# Patient Record
Sex: Female | Born: 1975 | Race: White | Hispanic: No | Marital: Single | Smoking: Current every day smoker
Health system: Southern US, Community
[De-identification: ages and names within clinical notes are randomized; demographics above are authoritative.]

## PROBLEM LIST (undated history)

## (undated) DIAGNOSIS — R42 Dizziness and giddiness: Secondary | ICD-10-CM

## (undated) HISTORY — PX: TUBAL LIGATION: SHX77

---

## 1997-09-15 ENCOUNTER — Encounter: Admission: RE | Admit: 1997-09-15 | Discharge: 1997-09-15 | Payer: Self-pay | Admitting: Family Medicine

## 1997-10-11 ENCOUNTER — Encounter: Admission: RE | Admit: 1997-10-11 | Discharge: 1997-10-11 | Payer: Self-pay | Admitting: Family Medicine

## 1997-10-13 ENCOUNTER — Ambulatory Visit (HOSPITAL_COMMUNITY): Admission: RE | Admit: 1997-10-13 | Discharge: 1997-10-13 | Payer: Self-pay | Admitting: *Deleted

## 1997-11-10 ENCOUNTER — Encounter: Admission: RE | Admit: 1997-11-10 | Discharge: 1997-11-10 | Payer: Self-pay | Admitting: Sports Medicine

## 1997-11-12 ENCOUNTER — Emergency Department (HOSPITAL_COMMUNITY): Admission: EM | Admit: 1997-11-12 | Discharge: 1997-11-12 | Payer: Self-pay | Admitting: Emergency Medicine

## 1997-11-15 ENCOUNTER — Encounter: Admission: RE | Admit: 1997-11-15 | Discharge: 1997-11-15 | Payer: Self-pay | Admitting: Family Medicine

## 1997-11-29 ENCOUNTER — Encounter: Admission: RE | Admit: 1997-11-29 | Discharge: 1998-02-27 | Payer: Self-pay | Admitting: *Deleted

## 1997-11-30 ENCOUNTER — Encounter: Admission: RE | Admit: 1997-11-30 | Discharge: 1997-11-30 | Payer: Self-pay | Admitting: Family Medicine

## 1997-12-15 ENCOUNTER — Encounter: Admission: RE | Admit: 1997-12-15 | Discharge: 1997-12-15 | Payer: Self-pay | Admitting: Family Medicine

## 1997-12-27 ENCOUNTER — Encounter: Admission: RE | Admit: 1997-12-27 | Discharge: 1997-12-27 | Payer: Self-pay | Admitting: Family Medicine

## 1998-01-09 ENCOUNTER — Ambulatory Visit (HOSPITAL_COMMUNITY): Admission: RE | Admit: 1998-01-09 | Discharge: 1998-01-09 | Payer: Self-pay | Admitting: *Deleted

## 1998-01-12 ENCOUNTER — Encounter: Admission: RE | Admit: 1998-01-12 | Discharge: 1998-01-12 | Payer: Self-pay | Admitting: Family Medicine

## 1998-01-31 ENCOUNTER — Encounter: Admission: RE | Admit: 1998-01-31 | Discharge: 1998-01-31 | Payer: Self-pay | Admitting: Sports Medicine

## 1998-02-06 ENCOUNTER — Encounter (HOSPITAL_COMMUNITY): Admission: RE | Admit: 1998-02-06 | Discharge: 1998-02-16 | Payer: Self-pay | Admitting: Obstetrics & Gynecology

## 1998-02-15 ENCOUNTER — Inpatient Hospital Stay (HOSPITAL_COMMUNITY): Admission: AD | Admit: 1998-02-15 | Discharge: 1998-02-17 | Payer: Self-pay | Admitting: *Deleted

## 1998-02-17 ENCOUNTER — Encounter: Admission: RE | Admit: 1998-02-17 | Discharge: 1998-02-17 | Payer: Self-pay | Admitting: Family Medicine

## 1998-03-29 ENCOUNTER — Encounter: Admission: RE | Admit: 1998-03-29 | Discharge: 1998-03-29 | Payer: Self-pay | Admitting: Family Medicine

## 1998-03-29 ENCOUNTER — Other Ambulatory Visit: Admission: RE | Admit: 1998-03-29 | Discharge: 1998-03-29 | Payer: Self-pay | Admitting: Family Medicine

## 1998-08-23 ENCOUNTER — Emergency Department (HOSPITAL_COMMUNITY): Admission: EM | Admit: 1998-08-23 | Discharge: 1998-08-23 | Payer: Self-pay | Admitting: Emergency Medicine

## 1999-04-11 ENCOUNTER — Emergency Department (HOSPITAL_COMMUNITY): Admission: EM | Admit: 1999-04-11 | Discharge: 1999-04-11 | Payer: Self-pay | Admitting: *Deleted

## 1999-04-18 ENCOUNTER — Inpatient Hospital Stay (HOSPITAL_COMMUNITY): Admission: AD | Admit: 1999-04-18 | Discharge: 1999-04-18 | Payer: Self-pay | Admitting: *Deleted

## 1999-05-04 ENCOUNTER — Emergency Department (HOSPITAL_COMMUNITY): Admission: EM | Admit: 1999-05-04 | Discharge: 1999-05-04 | Payer: Self-pay | Admitting: Emergency Medicine

## 1999-05-13 ENCOUNTER — Emergency Department (HOSPITAL_COMMUNITY): Admission: EM | Admit: 1999-05-13 | Discharge: 1999-05-13 | Payer: Self-pay | Admitting: Emergency Medicine

## 1999-07-28 ENCOUNTER — Encounter: Payer: Self-pay | Admitting: *Deleted

## 1999-07-28 ENCOUNTER — Inpatient Hospital Stay (HOSPITAL_COMMUNITY): Admission: AD | Admit: 1999-07-28 | Discharge: 1999-07-28 | Payer: Self-pay | Admitting: Obstetrics

## 1999-08-13 ENCOUNTER — Encounter: Admission: RE | Admit: 1999-08-13 | Discharge: 1999-08-13 | Payer: Self-pay | Admitting: Family Medicine

## 1999-08-22 ENCOUNTER — Encounter: Admission: RE | Admit: 1999-08-22 | Discharge: 1999-08-22 | Payer: Self-pay | Admitting: Family Medicine

## 1999-08-28 ENCOUNTER — Encounter: Admission: RE | Admit: 1999-08-28 | Discharge: 1999-08-28 | Payer: Self-pay | Admitting: Family Medicine

## 1999-08-28 ENCOUNTER — Other Ambulatory Visit: Admission: RE | Admit: 1999-08-28 | Discharge: 1999-08-28 | Payer: Self-pay | Admitting: *Deleted

## 1999-10-02 ENCOUNTER — Encounter: Admission: RE | Admit: 1999-10-02 | Discharge: 1999-10-02 | Payer: Self-pay | Admitting: Family Medicine

## 1999-11-01 ENCOUNTER — Encounter: Admission: RE | Admit: 1999-11-01 | Discharge: 1999-11-01 | Payer: Self-pay | Admitting: Family Medicine

## 1999-11-12 ENCOUNTER — Inpatient Hospital Stay (HOSPITAL_COMMUNITY): Admission: AD | Admit: 1999-11-12 | Discharge: 1999-11-12 | Payer: Self-pay | Admitting: Obstetrics

## 1999-11-13 ENCOUNTER — Ambulatory Visit (HOSPITAL_COMMUNITY): Admission: RE | Admit: 1999-11-13 | Discharge: 1999-11-13 | Payer: Self-pay | Admitting: Sports Medicine

## 1999-12-04 ENCOUNTER — Encounter: Admission: RE | Admit: 1999-12-04 | Discharge: 1999-12-04 | Payer: Self-pay | Admitting: Sports Medicine

## 1999-12-05 ENCOUNTER — Encounter: Admission: RE | Admit: 1999-12-05 | Discharge: 1999-12-05 | Payer: Self-pay | Admitting: Family Medicine

## 2000-01-03 ENCOUNTER — Encounter: Admission: RE | Admit: 2000-01-03 | Discharge: 2000-01-03 | Payer: Self-pay | Admitting: Family Medicine

## 2000-01-21 ENCOUNTER — Encounter: Admission: RE | Admit: 2000-01-21 | Discharge: 2000-01-21 | Payer: Self-pay | Admitting: Family Medicine

## 2000-01-24 ENCOUNTER — Ambulatory Visit (HOSPITAL_COMMUNITY): Admission: RE | Admit: 2000-01-24 | Discharge: 2000-01-24 | Payer: Self-pay

## 2000-02-07 ENCOUNTER — Encounter: Admission: RE | Admit: 2000-02-07 | Discharge: 2000-02-07 | Payer: Self-pay | Admitting: Family Medicine

## 2000-02-14 ENCOUNTER — Encounter: Admission: RE | Admit: 2000-02-14 | Discharge: 2000-02-14 | Payer: Self-pay | Admitting: Family Medicine

## 2000-02-21 ENCOUNTER — Inpatient Hospital Stay (HOSPITAL_COMMUNITY): Admission: AD | Admit: 2000-02-21 | Discharge: 2000-02-21 | Payer: Self-pay | Admitting: *Deleted

## 2000-02-21 ENCOUNTER — Encounter: Admission: RE | Admit: 2000-02-21 | Discharge: 2000-02-21 | Payer: Self-pay | Admitting: Family Medicine

## 2000-02-28 ENCOUNTER — Encounter: Admission: RE | Admit: 2000-02-28 | Discharge: 2000-02-28 | Payer: Self-pay | Admitting: Family Medicine

## 2000-03-03 ENCOUNTER — Inpatient Hospital Stay (HOSPITAL_COMMUNITY): Admission: AD | Admit: 2000-03-03 | Discharge: 2000-03-03 | Payer: Self-pay | Admitting: Obstetrics

## 2000-03-12 ENCOUNTER — Inpatient Hospital Stay (HOSPITAL_COMMUNITY): Admission: AD | Admit: 2000-03-12 | Discharge: 2000-03-12 | Payer: Self-pay | Admitting: Obstetrics

## 2000-03-13 ENCOUNTER — Encounter: Admission: RE | Admit: 2000-03-13 | Discharge: 2000-03-13 | Payer: Self-pay | Admitting: Family Medicine

## 2000-03-14 ENCOUNTER — Encounter (HOSPITAL_COMMUNITY): Admission: RE | Admit: 2000-03-14 | Discharge: 2000-03-17 | Payer: Self-pay | Admitting: *Deleted

## 2000-03-16 ENCOUNTER — Inpatient Hospital Stay (HOSPITAL_COMMUNITY): Admission: AD | Admit: 2000-03-16 | Discharge: 2000-03-18 | Payer: Self-pay | Admitting: Obstetrics & Gynecology

## 2000-03-16 ENCOUNTER — Encounter (INDEPENDENT_AMBULATORY_CARE_PROVIDER_SITE_OTHER): Payer: Self-pay | Admitting: Specialist

## 2000-03-27 ENCOUNTER — Encounter (INDEPENDENT_AMBULATORY_CARE_PROVIDER_SITE_OTHER): Payer: Self-pay | Admitting: *Deleted

## 2000-03-27 LAB — CONVERTED CEMR LAB

## 2000-04-10 ENCOUNTER — Encounter: Admission: RE | Admit: 2000-04-10 | Discharge: 2000-04-10 | Payer: Self-pay | Admitting: Family Medicine

## 2000-04-28 ENCOUNTER — Other Ambulatory Visit: Admission: RE | Admit: 2000-04-28 | Discharge: 2000-04-28 | Payer: Self-pay | Admitting: Family Medicine

## 2000-04-28 ENCOUNTER — Encounter: Admission: RE | Admit: 2000-04-28 | Discharge: 2000-04-28 | Payer: Self-pay | Admitting: Family Medicine

## 2000-09-29 ENCOUNTER — Emergency Department (HOSPITAL_COMMUNITY): Admission: EM | Admit: 2000-09-29 | Discharge: 2000-09-29 | Payer: Self-pay | Admitting: *Deleted

## 2000-09-29 ENCOUNTER — Encounter: Payer: Self-pay | Admitting: *Deleted

## 2003-08-08 ENCOUNTER — Emergency Department (HOSPITAL_COMMUNITY): Admission: EM | Admit: 2003-08-08 | Discharge: 2003-08-09 | Payer: Self-pay | Admitting: Emergency Medicine

## 2003-08-26 ENCOUNTER — Encounter: Admission: RE | Admit: 2003-08-26 | Discharge: 2003-08-26 | Payer: Self-pay | Admitting: Family Medicine

## 2003-12-15 ENCOUNTER — Emergency Department (HOSPITAL_COMMUNITY): Admission: EM | Admit: 2003-12-15 | Discharge: 2003-12-15 | Payer: Self-pay | Admitting: Emergency Medicine

## 2004-04-11 ENCOUNTER — Emergency Department (HOSPITAL_COMMUNITY): Admission: EM | Admit: 2004-04-11 | Discharge: 2004-04-11 | Payer: Self-pay | Admitting: Emergency Medicine

## 2004-10-01 ENCOUNTER — Emergency Department (HOSPITAL_COMMUNITY): Admission: EM | Admit: 2004-10-01 | Discharge: 2004-10-01 | Payer: Self-pay | Admitting: *Deleted

## 2004-10-24 ENCOUNTER — Ambulatory Visit: Payer: Self-pay | Admitting: Family Medicine

## 2004-11-15 ENCOUNTER — Ambulatory Visit: Payer: Self-pay | Admitting: Family Medicine

## 2005-02-28 ENCOUNTER — Inpatient Hospital Stay (HOSPITAL_COMMUNITY): Admission: RE | Admit: 2005-02-28 | Discharge: 2005-03-02 | Payer: Self-pay | Admitting: Psychiatry

## 2005-03-01 ENCOUNTER — Ambulatory Visit: Payer: Self-pay | Admitting: Psychiatry

## 2005-04-26 ENCOUNTER — Ambulatory Visit: Payer: Self-pay | Admitting: Family Medicine

## 2005-05-06 ENCOUNTER — Ambulatory Visit: Payer: Self-pay | Admitting: Sports Medicine

## 2005-06-20 ENCOUNTER — Ambulatory Visit: Payer: Self-pay | Admitting: Family Medicine

## 2005-08-29 ENCOUNTER — Emergency Department (HOSPITAL_COMMUNITY): Admission: EM | Admit: 2005-08-29 | Discharge: 2005-08-29 | Payer: Self-pay | Admitting: Emergency Medicine

## 2005-12-22 ENCOUNTER — Emergency Department (HOSPITAL_COMMUNITY): Admission: EM | Admit: 2005-12-22 | Discharge: 2005-12-22 | Payer: Self-pay | Admitting: Emergency Medicine

## 2006-04-14 ENCOUNTER — Encounter: Admission: RE | Admit: 2006-04-14 | Discharge: 2006-04-14 | Payer: Self-pay | Admitting: Occupational Medicine

## 2006-04-29 ENCOUNTER — Ambulatory Visit: Payer: Self-pay | Admitting: Family Medicine

## 2006-05-27 ENCOUNTER — Emergency Department (HOSPITAL_COMMUNITY): Admission: EM | Admit: 2006-05-27 | Discharge: 2006-05-27 | Payer: Self-pay | Admitting: Emergency Medicine

## 2006-07-24 DIAGNOSIS — M545 Low back pain: Secondary | ICD-10-CM

## 2006-07-24 DIAGNOSIS — F17200 Nicotine dependence, unspecified, uncomplicated: Secondary | ICD-10-CM | POA: Insufficient documentation

## 2006-07-24 DIAGNOSIS — L2089 Other atopic dermatitis: Secondary | ICD-10-CM | POA: Insufficient documentation

## 2006-07-25 ENCOUNTER — Encounter (INDEPENDENT_AMBULATORY_CARE_PROVIDER_SITE_OTHER): Payer: Self-pay | Admitting: *Deleted

## 2006-08-28 ENCOUNTER — Encounter (INDEPENDENT_AMBULATORY_CARE_PROVIDER_SITE_OTHER): Payer: Self-pay | Admitting: Family Medicine

## 2006-08-28 ENCOUNTER — Other Ambulatory Visit: Admission: RE | Admit: 2006-08-28 | Discharge: 2006-08-28 | Payer: Self-pay | Admitting: Family Medicine

## 2006-08-28 ENCOUNTER — Ambulatory Visit: Payer: Self-pay | Admitting: Sports Medicine

## 2006-08-28 DIAGNOSIS — N6019 Diffuse cystic mastopathy of unspecified breast: Secondary | ICD-10-CM | POA: Insufficient documentation

## 2006-08-28 LAB — CONVERTED CEMR LAB
CO2: 21 meq/L (ref 19–32)
Chloride: 107 meq/L (ref 96–112)
Creatinine, Ser: 1.03 mg/dL (ref 0.40–1.20)

## 2006-08-29 ENCOUNTER — Encounter (INDEPENDENT_AMBULATORY_CARE_PROVIDER_SITE_OTHER): Payer: Self-pay | Admitting: Family Medicine

## 2006-08-29 LAB — CONVERTED CEMR LAB: T3, Free: 3 pg/mL (ref 2.3–4.2)

## 2006-09-02 ENCOUNTER — Encounter (INDEPENDENT_AMBULATORY_CARE_PROVIDER_SITE_OTHER): Payer: Self-pay | Admitting: Family Medicine

## 2006-09-03 ENCOUNTER — Encounter: Admission: RE | Admit: 2006-09-03 | Discharge: 2006-09-03 | Payer: Self-pay | Admitting: Sports Medicine

## 2006-09-03 ENCOUNTER — Encounter (INDEPENDENT_AMBULATORY_CARE_PROVIDER_SITE_OTHER): Payer: Self-pay | Admitting: Family Medicine

## 2006-10-02 ENCOUNTER — Ambulatory Visit: Payer: Self-pay | Admitting: Family Medicine

## 2006-10-02 ENCOUNTER — Telehealth (INDEPENDENT_AMBULATORY_CARE_PROVIDER_SITE_OTHER): Payer: Self-pay | Admitting: *Deleted

## 2006-10-02 ENCOUNTER — Encounter: Payer: Self-pay | Admitting: Family Medicine

## 2006-10-02 DIAGNOSIS — L293 Anogenital pruritus, unspecified: Secondary | ICD-10-CM | POA: Insufficient documentation

## 2006-10-02 LAB — CONVERTED CEMR LAB
Glucose, Urine, Semiquant: NEGATIVE
Ketones, urine, test strip: NEGATIVE
Nitrite: NEGATIVE
Specific Gravity, Urine: 1.015
pH: 7

## 2006-10-08 ENCOUNTER — Encounter: Payer: Self-pay | Admitting: Family Medicine

## 2006-10-08 ENCOUNTER — Telehealth (INDEPENDENT_AMBULATORY_CARE_PROVIDER_SITE_OTHER): Payer: Self-pay | Admitting: Family Medicine

## 2006-12-28 ENCOUNTER — Encounter: Admission: RE | Admit: 2006-12-28 | Discharge: 2006-12-28 | Payer: Self-pay | Admitting: Family Medicine

## 2007-08-10 ENCOUNTER — Telehealth (INDEPENDENT_AMBULATORY_CARE_PROVIDER_SITE_OTHER): Payer: Self-pay | Admitting: Family Medicine

## 2007-08-11 ENCOUNTER — Telehealth: Payer: Self-pay | Admitting: *Deleted

## 2007-09-11 ENCOUNTER — Encounter (INDEPENDENT_AMBULATORY_CARE_PROVIDER_SITE_OTHER): Payer: Self-pay | Admitting: Family Medicine

## 2007-09-11 ENCOUNTER — Ambulatory Visit: Payer: Self-pay | Admitting: Family Medicine

## 2007-09-11 ENCOUNTER — Other Ambulatory Visit: Admission: RE | Admit: 2007-09-11 | Discharge: 2007-09-11 | Payer: Self-pay | Admitting: Family Medicine

## 2007-09-11 DIAGNOSIS — R079 Chest pain, unspecified: Secondary | ICD-10-CM

## 2007-09-11 LAB — CONVERTED CEMR LAB
HDL: 46 mg/dL (ref >39)
LDL Cholesterol: 118 mg/dL — ABNORMAL HIGH (ref 0–99)
Total CHOL/HDL Ratio: 3.8

## 2007-09-15 ENCOUNTER — Telehealth (INDEPENDENT_AMBULATORY_CARE_PROVIDER_SITE_OTHER): Payer: Self-pay | Admitting: Family Medicine

## 2007-09-17 ENCOUNTER — Telehealth (INDEPENDENT_AMBULATORY_CARE_PROVIDER_SITE_OTHER): Payer: Self-pay | Admitting: Family Medicine

## 2007-11-20 ENCOUNTER — Encounter: Admission: RE | Admit: 2007-11-20 | Discharge: 2007-11-20 | Payer: Self-pay | Admitting: Family Medicine

## 2008-01-01 ENCOUNTER — Telehealth: Payer: Self-pay | Admitting: *Deleted

## 2008-01-24 ENCOUNTER — Telehealth: Payer: Self-pay | Admitting: Family Medicine

## 2008-01-25 ENCOUNTER — Encounter: Payer: Self-pay | Admitting: Family Medicine

## 2008-01-25 ENCOUNTER — Ambulatory Visit: Payer: Self-pay | Admitting: Family Medicine

## 2008-01-25 ENCOUNTER — Encounter: Admission: RE | Admit: 2008-01-25 | Discharge: 2008-01-25 | Payer: Self-pay | Admitting: Family Medicine

## 2008-01-25 DIAGNOSIS — M79609 Pain in unspecified limb: Secondary | ICD-10-CM | POA: Insufficient documentation

## 2008-01-26 ENCOUNTER — Telehealth: Payer: Self-pay | Admitting: Family Medicine

## 2008-03-08 ENCOUNTER — Telehealth (INDEPENDENT_AMBULATORY_CARE_PROVIDER_SITE_OTHER): Payer: Self-pay | Admitting: *Deleted

## 2008-05-02 ENCOUNTER — Telehealth: Payer: Self-pay | Admitting: *Deleted

## 2008-07-18 ENCOUNTER — Telehealth (INDEPENDENT_AMBULATORY_CARE_PROVIDER_SITE_OTHER): Payer: Self-pay | Admitting: Family Medicine

## 2008-11-15 ENCOUNTER — Telehealth (INDEPENDENT_AMBULATORY_CARE_PROVIDER_SITE_OTHER): Payer: Self-pay | Admitting: Family Medicine

## 2008-12-29 ENCOUNTER — Telehealth: Payer: Self-pay | Admitting: *Deleted

## 2009-09-04 ENCOUNTER — Telehealth: Payer: Self-pay | Admitting: Family Medicine

## 2010-04-10 ENCOUNTER — Ambulatory Visit: Payer: Self-pay | Admitting: Family Medicine

## 2010-04-10 ENCOUNTER — Other Ambulatory Visit: Admission: RE | Admit: 2010-04-10 | Discharge: 2010-04-10 | Payer: Self-pay | Admitting: Family Medicine

## 2010-04-10 DIAGNOSIS — E785 Hyperlipidemia, unspecified: Secondary | ICD-10-CM

## 2010-04-10 DIAGNOSIS — N63 Unspecified lump in unspecified breast: Secondary | ICD-10-CM

## 2010-04-12 LAB — CONVERTED CEMR LAB: Pap Smear: NEGATIVE

## 2010-04-16 ENCOUNTER — Encounter: Admission: RE | Admit: 2010-04-16 | Discharge: 2010-04-16 | Payer: Self-pay | Admitting: Family Medicine

## 2010-04-23 ENCOUNTER — Telehealth: Payer: Self-pay | Admitting: Family Medicine

## 2010-06-26 NOTE — Progress Notes (Signed)
Summary: triage: fell and hit head  Phone Note Call from Patient Call back at [phone]   Caller: Patient Summary of Call: Pt fell yesterday and hit her head.   Initial call taken by: Clydell Hakim,  September 04, 2009 9:32 AM  Follow-up for Phone Call        3PM YESTERDAY FELL DOWN BACK STEPS & HIT THE BACK OF HER HEAD. HAS A BUMP. EATING FELL, NO NAUSEA.  had a HA yesterday. none today. pain was gone by 6pm. took motrin. iced the bump. not sure if she should be seen. will come at 3:30 as work in today Follow-up by: Golden Circle RN,  September 04, 2009 9:36 AM  Additional Follow-up for Phone Call Additional follow up Details #1::        thanks for working her in.  Additional Follow-up by: Jamie Brookes MD,  September 04, 2009 4:36 PM

## 2010-06-26 NOTE — Assessment & Plan Note (Signed)
Summary: pap/pelvic/bmc   call Pt at [phone]   Vital Signs:  Patient profile:   35 year old female LMP:     03/27/2010 Weight:      173 pounds Temp:     98.8 degrees F oral Pulse rate:   84 / minute Pulse rhythm:   regular BP sitting:   123 / 81  (left arm) Cuff size:   regular  Vitals Entered By: Loralee Pacas CMA (April 10, 2010 10:40 AM) CC: breast lump, knee pain, pap smear Is Patient Diabetic? No LMP (date): 03/27/2010     Enter LMP: 03/27/2010 Last PAP Result normal   Primary Care Provider:  Jamie Brookes MD  CC:  breast lump, knee pain, and pap smear.  History of Present Illness: Breast lump: Pt comes in today concerned about a lump in her breast. her mom had breast ca sometime in her 30's per patient. Shestarted having breast mammograms at age 27 and has had a total of 2. Pt says that cysts were seen in the past but she has not had to have any biopsy's.   knee pain; Pt has some knee pain when she works on her knees at [employer]. She is not allowed to wear knee pads on the outside of her uniform. 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   Pap smear: Pt is due for a pap smear today.    Habits & Providers  Alcohol-Tobacco-Diet     Alcohol drinks/day: <1     Tobacco Status: current     Cigarette Packs/Day:  ~1ppd  Exercise-Depression-Behavior     Does Patient Exercise: yes     Exercise Counseling: to improve exercise regimen     Type of exercise: cardio     Exercise (avg: min/session): 30-60     Times/week: <3     Have you felt down or hopeless? no     Have you felt little pleasure in things? yes     Depression Counseling: further diagnostic testing and/or other treatment is indicated     Seat Belt Use: always  Current Medications (verified): 1)  None  Allergies (verified): No Known Drug Allergies  Social History: Does Patient Exercise:  yes Seat Belt Use:   always  Review of Systems        vitals reviewed and pertinent negatives and positives seen in HPI   Physical Exam  General:  Well-developed,well-nourished,in no acute distress; alert,appropriate and cooperative throughout examination Breasts:  normal left breast, Rt breast had small lump at 12:00. Genitalia:  Normal introitus for age, no external lesions, no vaginal discharge, mucosa pink and moist, no vaginal or cervical lesions, no vaginal atrophy, no friaility or hemorrhage, normal uterus size and position, no adnexal masses or tenderness Psych:  Cognition and judgment appear intact. Alert and cooperative with normal attention span and concentration. No apparent delusions, illusions, hallucinations   Impression & Recommendations:  Problem # 1:  BREAST MASS, RIGHT (ICD-611.72) Assessment New New breast mass at 12:00. Will send for [place] and or mammogram. Pt had a mom with Bc Ca in her 30's and has already started getting mammograms. She should continue with this.   Orders: Mammogram (Diagnostic) (Mammo) [facility]- Est Level  3 (09811)  Problem # 2:  SCREENING FOR MALIGNANT NEOPLASM, CERVIX (ICD-V76.2) Assessment: Unchanged Pap smear done today.   Orders: Pap Smear-[facility] ([id number]) [facility]- Est Level  3 (13086)  Other Orders: Future Orders: Lipid-[facility] ([id number]) ... 04/11/2011  Patient Instructions: 1)  IT was nice to meet you today.  2)  I felt a lump in the breast which may be a cyst but I would like to have it evaluated closer.  3)  We are setting up a mammogram for you.  4)  You have had elevated LDL cholesterol in the past. We need to have it checked again at your convienence.  5)  Have a wonderful holiday season.    Orders Added: 1)  Lipid-[facility] [[id number]] 2)  Pap Smear-[facility] [[id number]] 3)  Mammogram (Diagnostic) [Mammo] 4)  [facility]- Est Level  3 [33295]

## 2010-06-26 NOTE — Progress Notes (Signed)
°  Phone Note Call from Patient   Caller: Patient Summary of Call: Ms. Tomasello is calling because she has not had a bowel movement in one week.  She talked to the pharmacist today, and he recommended miralax.  She has tried one 17 g dose of this this AM without relief.  She is passing gas.  Denies abdominal pain or vomiting.  States she just feels bloated and full and would like relief.  She is wondering if she should take a stimulant laxative.  Recommended taking the miralax in gatorade every 4 hours until result produced, and to use either a fleet's enema or a dulcolax suppository from below.  Recommended against stimulant laxative by mouth.  If no BM overnight with these measures, advised patient to make work in appt at [facility] to ensure no impaction.  Advised to report to ER for failure to pass gas, pain, vomiting. Patient in agreement. Initial call taken by: Levander Campion MD,  August 10, 2007 7:45 PM

## 2010-06-26 NOTE — Progress Notes (Signed)
Summary: Req rx: Smoking Cessation with Zyban.   Phone Note Call from Patient Call back at [phone]   Reason for Call: Talk to Doctor Summary of Call: pt wants to know if she can try wellbutrin to help her quit smoking, chantix did not work Initial call taken by: Knox Royalty,  April 23, 2010 3:41 PM  Follow-up for Phone Call        That sounds reasonable. She should start taking the medicine (1 tab by mouth qDay for 3 days and then increased to 1 tab by mouth two times a day after that (last dose no later than 6 pm)) and then plan to stop smoking 5-7 days after starting the medicine. Don't use with alcohol, don't crush or chew tablets.   I called patient and let her know the above. Meds sent into [facility].  Follow-up by: Jamie Brookes MD,  April 24, 2010 8:58 AM    New/Updated Medications: BUPROPION HCL (SMOKING DETER) 150 MG XR12H-TAB (BUPROPION HCL (SMOKING DETER)) take 1 tab in the AM for 3 days, then increase to 1 in the AM and 1 in the PM (before 6 pm). Quit smoking 5-7 days after starting Prescriptions: BUPROPION HCL (SMOKING DETER) 150 MG XR12H-TAB (BUPROPION HCL (SMOKING DETER)) take 1 tab in the AM for 3 days, then increase to 1 in the AM and 1 in the PM (before 6 pm). Quit smoking 5-7 days after starting  #60 x 1   Entered and Authorized by:   Jamie Brookes MD   Signed by:   Jamie Brookes MD on 04/24/2010   Method used:   Electronically to        [facility] [phone]* (retail)       [address]       [place], [place]  [address]       Ph: [phone] or [phone]       Fax: [phone]   RxID:   [id number]

## 2010-06-27 ENCOUNTER — Encounter: Payer: Self-pay | Admitting: *Deleted

## 2010-08-13 ENCOUNTER — Telehealth: Payer: Self-pay | Admitting: Family Medicine

## 2010-08-13 NOTE — Telephone Encounter (Signed)
Patient has been on her menstrual cycle x 14 days.  It started out as normal period, lasting about 4 to 5 days, spotted a few days afterwards then started back again with a heavy flow the following week.  Says that she has gone through a full box of pads in the past 2 weeks.  Patient does not use BC, had her tubes tied after the birth of her 34 y/o daughter.  Has not experienced any cramping or abdominal pain with this period, other than the typical cramping the first few days after her cycle first came on.  Advised her that she should be seen by her PCP.  Gave her a 4:14 appt this Thursday.

## 2010-08-13 NOTE — Telephone Encounter (Signed)
Pt has been on  cycle for 14 days, offered pt an appt, would like to speak with RN 1st

## 2010-08-14 ENCOUNTER — Ambulatory Visit (INDEPENDENT_AMBULATORY_CARE_PROVIDER_SITE_OTHER): Payer: Self-pay | Admitting: Family Medicine

## 2010-08-14 ENCOUNTER — Telehealth: Payer: Self-pay | Admitting: Family Medicine

## 2010-08-14 ENCOUNTER — Encounter: Payer: Self-pay | Admitting: Family Medicine

## 2010-08-14 VITALS — BP 112/80 | HR 85 | Temp 98.6°F | Wt 169.2 lb

## 2010-08-14 DIAGNOSIS — N920 Excessive and frequent menstruation with regular cycle: Secondary | ICD-10-CM

## 2010-08-14 DIAGNOSIS — N938 Other specified abnormal uterine and vaginal bleeding: Secondary | ICD-10-CM | POA: Insufficient documentation

## 2010-08-14 LAB — CBC
MCH: 31.1 pg (ref 26.0–34.0)
MCHC: 33.2 g/dL (ref 30.0–36.0)
Platelets: 266 10*3/uL (ref 150–400)

## 2010-08-14 MED ORDER — MEDROXYPROGESTERONE ACETATE 10 MG PO TABS: 10.0000 mg | ORAL_TABLET | Freq: Every day | ORAL | Status: DC

## 2010-08-14 NOTE — Progress Notes (Signed)
  Subjective:    Patient ID: [name], female    DOB: February 21, 1976, 35 y.o.   MRN: [id number]  HPI 1. Menorrhagia:  Pt has had vaginal bleeding for the past 2 weeks.  It started at the time of her normal cycle.  It has been heavy off and on since then.  She is using 4-5 pads a day.  It is not getting better.  She has never had something like this happen before.  She has not tried anything to make it better.  Nothing makes it worse.    FamHx: Mother with breast, colon, and uterine cancer   Review of Systems She endorses fatigue, headache, and mild light-headedness.  Denies abdominal pain, n/v, vaginal discharge, dysuria.    Objective:   Physical Exam  Constitutional: No distress.       Overweight  Eyes: Conjunctivae are normal. No scleral icterus.  Cardiovascular: Normal rate, regular rhythm and normal heart sounds.   Pulmonary/Chest: Effort normal and breath sounds normal. No respiratory distress. She has no wheezes.  Abdominal: Soft. Bowel sounds are normal. She exhibits no distension and no mass. There is no tenderness. There is no rebound and no guarding.  Musculoskeletal: She exhibits no edema.  Lymphadenopathy:    She has no cervical adenopathy.  Skin: Skin is warm and dry. No rash noted. She is not diaphoretic. No pallor.          Assessment & Plan:

## 2010-08-14 NOTE — Telephone Encounter (Signed)
Patient states she has had vaginal bleeding for a total of two weeks. She had spotting Sat , Sun and Monday. Last night started again with heavy bleeding and passing clots. Requested to be seen today. appointment scheduled for work in . Appointment 08/16/2010 cancelled.

## 2010-08-14 NOTE — Progress Notes (Deleted)
Patient states she has spotted for  days, Sat , Sun and Monday. Before that has had heavy bleeding off and on  and has had vaginal bleeding for total of about two weeks. Last night started again with heavy bleeding and passing clots. Appointment scheduled today for work in at patient 's request . Appointment Thursday cancelled.

## 2010-08-14 NOTE — Patient Instructions (Signed)
It was nice to meet you today I am going to put you on Provera to help stop the bleeding Please schedule a follow up appointment in a couple of weeks to check on your bleeding and discuss the need for further work up

## 2010-08-14 NOTE — Assessment & Plan Note (Signed)
Pt with vaginal bleeding for 2 weeks.  It is not getting better.  She is overweight so likely with too much estrogen.  Will treat with Provera to stop the bleeding.  She does have a significant family hx and further work up wouldn't be unreasonable.  Will have patient come back to discuss with PCP.  Pt may benefit from t/v [place].

## 2010-08-14 NOTE — Telephone Encounter (Signed)
Pt asking to speak with RN about her menstral cycle (see yesterdays phone note). Pt says her symptoms are different today.

## 2010-08-16 ENCOUNTER — Ambulatory Visit: Payer: Self-pay | Admitting: Family Medicine

## 2010-08-23 ENCOUNTER — Encounter: Payer: Self-pay | Admitting: Family Medicine

## 2010-08-23 ENCOUNTER — Ambulatory Visit (INDEPENDENT_AMBULATORY_CARE_PROVIDER_SITE_OTHER): Payer: Self-pay | Admitting: Family Medicine

## 2010-08-23 VITALS — BP 118/82 | HR 80 | Temp 98.5°F | Wt 170.6 lb

## 2010-08-23 DIAGNOSIS — N920 Excessive and frequent menstruation with regular cycle: Secondary | ICD-10-CM

## 2010-08-23 LAB — TSH: TSH: 1.379 u[IU]/mL (ref 0.350–4.500)

## 2010-08-23 NOTE — Assessment & Plan Note (Addendum)
Only spotting now. Will order TSH, urine preg today.  No fam or personal hx of bleeding do.  Will schedule transvaginal ultrasound and endometrial biopsy given her family history.  precepted with Dr. Mauricio Po

## 2010-08-23 NOTE — Patient Instructions (Signed)
Most reasons for irregular uterine bleeding are benign. Will check labwork today, I will send you letter if normal, will call you otherwise. I will schedule an ultrasound to look for causes of bleeding such a fibroids. Make appointment for follow-up in women's clinic for endometrial biopsy

## 2010-08-23 NOTE — Progress Notes (Signed)
  Subjective:    Patient ID: [name], female    DOB: [birth year], 35 y.o.   MRN: [id number]  HPI Mar 6 started menses at typical time.  Has no history of irregular or heavy periods.  Continued to bleed for 2 weeks.  Was Seen by Dr. Lelon Perla here on Mar 20, was given course of provera.  Patient states bleeding stopped during course of provera and light spotting restarted several days afterwards.  She is sexually active, has BTL.      Review of Systems  Review of Systems She endorses fatigue, headache, and mild light-headedness.  Denies abdominal pain, fever, n/v, vaginal discharge, dysuria.       Objective:   Physical Exam  Constitutional: She appears well-developed and well-nourished.  Cardiovascular: Normal rate and regular rhythm.   Pulmonary/Chest: Breath sounds normal. She is in respiratory distress. She has no wheezes. She has no rales.  Abdominal: Soft. She exhibits no distension. There is no tenderness. There is no rebound and no guarding.  Genitourinary: Uterus normal. Uterus is not enlarged, not fixed and not tender. Cervix exhibits no motion tenderness. Right adnexum displays no mass, no tenderness and no fullness. Left adnexum displays no mass, no tenderness and no fullness. There is bleeding around the vagina. No erythema or tenderness around the vagina. No foreign body around the vagina. No signs of injury around the vagina. No vaginal discharge found.          Assessment & Plan:

## 2010-08-28 ENCOUNTER — Ambulatory Visit (HOSPITAL_COMMUNITY)
Admission: RE | Admit: 2010-08-28 | Discharge: 2010-08-28 | Disposition: A | Discharge status: Home / Self Care | Payer: Self-pay | Source: Ambulatory Visit | Attending: Family Medicine | Admitting: Family Medicine

## 2010-08-28 ENCOUNTER — Other Ambulatory Visit: Payer: Self-pay | Admitting: Family Medicine

## 2010-08-28 ENCOUNTER — Telehealth: Payer: Self-pay | Admitting: Family Medicine

## 2010-08-28 DIAGNOSIS — N938 Other specified abnormal uterine and vaginal bleeding: Secondary | ICD-10-CM | POA: Insufficient documentation

## 2010-08-28 DIAGNOSIS — Z8543 Personal history of malignant neoplasm of ovary: Secondary | ICD-10-CM | POA: Insufficient documentation

## 2010-08-28 DIAGNOSIS — N920 Excessive and frequent menstruation with regular cycle: Secondary | ICD-10-CM

## 2010-08-28 DIAGNOSIS — N949 Unspecified condition associated with female genital organs and menstrual cycle: Secondary | ICD-10-CM | POA: Insufficient documentation

## 2010-08-28 DIAGNOSIS — Z803 Family history of malignant neoplasm of breast: Secondary | ICD-10-CM | POA: Insufficient documentation

## 2010-08-28 NOTE — Telephone Encounter (Signed)
Left message that ultrasound normal and to continue with appt with endometrial [place] as previously discussed.

## 2010-08-30 ENCOUNTER — Encounter: Payer: Self-pay | Admitting: Family Medicine

## 2010-08-30 ENCOUNTER — Ambulatory Visit: Payer: Self-pay | Admitting: Family Medicine

## 2010-08-30 ENCOUNTER — Other Ambulatory Visit: Payer: Self-pay | Admitting: Family Medicine

## 2010-08-30 ENCOUNTER — Ambulatory Visit (INDEPENDENT_AMBULATORY_CARE_PROVIDER_SITE_OTHER): Payer: Self-pay | Admitting: Family Medicine

## 2010-08-30 VITALS — BP 117/86 | HR 96 | Temp 99.2°F | Ht 62.0 in | Wt 168.0 lb

## 2010-08-30 DIAGNOSIS — Z800 Family history of malignant neoplasm of digestive organs: Secondary | ICD-10-CM

## 2010-08-30 DIAGNOSIS — N938 Other specified abnormal uterine and vaginal bleeding: Secondary | ICD-10-CM

## 2010-08-30 DIAGNOSIS — Z803 Family history of malignant neoplasm of breast: Secondary | ICD-10-CM

## 2010-08-30 DIAGNOSIS — N949 Unspecified condition associated with female genital organs and menstrual cycle: Secondary | ICD-10-CM

## 2010-08-30 MED ORDER — IBUPROFEN 200 MG PO TABS
400.0000 mg | ORAL_TABLET | Freq: Once | ORAL | Status: AC
  Administered 2010-08-30: 400 mg via ORAL

## 2010-08-30 NOTE — Progress Notes (Signed)
Addended by: Loralee Pacas on: 08/30/2010 05:26 PM   Modules accepted: Orders

## 2010-08-30 NOTE — Progress Notes (Signed)
  Subjective:    Patient ID: CHARIAH BAILEY, female    DOB: 1975/12/17, 35 y.o.   MRN: [id number]  HPI  Episode of 18 day menses last month. Had previously had normal regular menses. We gave her provera and that stopped the bleeding. Had some spotting for  A few days after. No abdominal pain. No weight loss. No fever. No vaginal d/c other t han the   Review of Systems See HPI    Objective:   Physical Exam    WDWN NAD GU externally normal Bimanual normal Patient given informed consent, signed copy in the chart. Appropriate time out taken. Sterile technique was used. The patient was placed in the lithotomy position and the cervix brought into view with sterile speculum. Portio of cervix cleansed x 3 with betadine swabs. A tenaculum was placed in the anterior lip of the cervix. The uterus was sounded for depth. A pipelle was introduced to into the uterus, suction created,  and an endometrial sample was obtained. All equipment was removed and accounted for.The patient tolerated the procedure well. Minimal spotting type bleeding. Patient given post procedure instructions. We will notify her of pathology results.     Assessment & Plan:  DUb in patient with significant FH cancer (Mom er = breast cancer at 30 yoa and CIS adenocarcinoma colon at age 64). Reviewed PUS--normal w 11 mm stripe Await path--suspect this is related to perimenopause. She may need cycling on provera monthly for a while--given that she is a smoke that ot mirena would be best options I think. We discussed that today. I also agree with extra vigilance given maternal hx. I would plan on colonoscopy at age 67 for her for 1st screen [phone]

## 2010-09-03 ENCOUNTER — Telehealth: Payer: Self-pay | Admitting: Family Medicine

## 2010-09-03 ENCOUNTER — Encounter: Payer: Self-pay | Admitting: Family Medicine

## 2010-09-03 NOTE — Telephone Encounter (Signed)
Dear [name] Team Please call and let her know the endometrial biopsy was normal. I am sending her a letter [facility]! Denny Levy

## 2010-09-03 NOTE — Telephone Encounter (Signed)
LVM for patient to call back. ?

## 2010-09-04 NOTE — Telephone Encounter (Signed)
[name] received results of bx, but still want to discuss with you the cause of problem to begin with.  Please call her and let her know your thoughts.  Please call her on her cell phone at [phone]

## 2010-09-04 NOTE — Telephone Encounter (Signed)
Spoke w her at length--her Mom has just been dx with a lung mass so she is extra worried about  Cancer in general. She will keep bleeding record and f/u in three months or so with either me or her PCP--in the interim if she gets worried about irregular menses I will be happy to discuss with her by phone.

## 2010-10-12 NOTE — Discharge Summary (Signed)
[name], [name] NO.:  [id number]   MEDICAL RECORD NO.:  [id number]          PATIENT TYPE:  IPS   LOCATION:  0506                          FACILITY:  [facility]   PHYSICIAN:  Geoffery Lyons, M.D.      DATE OF BIRTH:  1976-02-22   DATE OF ADMISSION:  02/28/2005  DATE OF DISCHARGE:  03/02/2005                                 DISCHARGE SUMMARY   CHIEF COMPLAINT AND PRESENT ILLNESS:  This was the first admission to [facility] for this 35 year old separated female with two  children, left by the husband for another woman at the beginning of  September.  Had much marital conflict for two years.  Had rejected Zoloft  from PCP six months prior to this admission.  Tearful, overwhelmed,  anhedonic, decreased concentration.  Has considered crashing her car,  deterred from suicide by love for two children.   PAST PSYCHIATRIC HISTORY:  No prior treatment.   ALCOHOL/DRUG HISTORY:  Denies active use of alcohol or any other substances.   MEDICAL HISTORY:  Noncontributory.   MEDICATIONS:  None.   PHYSICAL EXAMINATION:  Performed and failed to show any acute findings.   LABORATORY DATA:  Blood chemistry with glucose 88.  Liver enzymes with SGOT  16, SGPT 13, total bilirubin 1.1, TSH 1.071.   MENTAL STATUS EXAM:  Fully alert, pleasant, cooperative female.  Speech  normal rate, tempo and production.  Mood depressed, overwhelmed.  Affect  depressed.  Thought processes positive for being overwhelmed, suicide  ruminations, can contract for safety.  No delusions.  No hallucinations.  Cognition was well-preserved.   ADMISSION DIAGNOSES:  AXIS I:  Major depression.  AXIS II:  No diagnosis.  AXIS III:  No diagnosis.  AXIS IV:  Moderate.  AXIS V:  GAF upon admission 30; highest GAF in the last year 70.   HOSPITAL COURSE:  She was admitted.  She was started in individual and group  psychotherapy.  She was given Ambien for sleep, Vistaril for anxiety.  Restarted on  Zoloft 75 mg per day.  She endorsed increased signs and  symptoms of depression, anxiety.  Claimed that after recommitting to the  marriage, her husband decided to see someone else.  Endorsed that he had  cheated on her before, then he said he was not going to do it anymore.  Endorsed that she did not trust him anymore.  Endorsed she went through the  same feeling when she lost her job.  She is taking care of their two  children.  The husband is not paying child support and she was to take care  of her paraplegic father.  Also endorsed that she heard the 14-year-old tell  her she wanted husband's girlfriend to be her mommy.  Endorsed this really  affected her.  Became more overwhelmed.  She wanted to get healthy and be  there for the children.  We started the Zoloft 25 mg per day.  We started  working on Pharmacologist and stress management.  On March 02, 2005, she was  wanting to go home.  She felt that she had been able to talk, get a lot of  these things out of her mind.  She felt that she was going to be able to  take it from here.  Had tolerated the Zoloft well and was willing to  continue it.  As she was in full contact with reality, committed to  treatment, we went ahead and discharged to outpatient follow-up.   DISCHARGE DIAGNOSES:  AXIS I:  Major depression.  Anxiety disorder not  otherwise specified.  AXIS II:  No diagnosis.  AXIS III:  No diagnosis.  AXIS IV:  Moderate.  AXIS V:  GAF upon discharge 50-55.   DISCHARGE MEDICATIONS:  Zoloft 25 mg per day with plans to increase to 50 mg  per day.   FOLLOW UP:  [facility].      Geoffery Lyons, M.D.  Electronically Signed     IL/MEDQ  D:  04/01/2005  T:  04/02/2005  Job:  [id number]

## 2011-02-25 ENCOUNTER — Telehealth: Payer: Self-pay | Admitting: Family Medicine

## 2011-02-25 NOTE — Telephone Encounter (Signed)
Patient states since March and the course of provera  her periods have been fine. 3-4 days in length. She had a period 2 weeks ago . Today she has started spotting again. Had some mild cramping this AM. She wants to know how long she should wait  before she comes back in. Last time she got really weak because it had gone on for 18 days. Will send message to Dr. Rivka Safer to advise.

## 2011-02-25 NOTE — Telephone Encounter (Signed)
You can advise the patient to schedule an appointment to see [place] if she gets weak, or the bleeding continues to be irregular.

## 2011-02-25 NOTE — Telephone Encounter (Signed)
Needs to talk to nurse about prolonged menses.  She was here in spring and is now experiencing same thing.  Can't afford to come back in yet, but wants to talk to nurse.

## 2011-02-25 NOTE — Telephone Encounter (Signed)
Patient notified

## 2011-07-06 ENCOUNTER — Telehealth: Payer: Self-pay | Admitting: Family Medicine

## 2011-07-06 NOTE — Telephone Encounter (Signed)
Called with feeling dizzy since this AM.  No n/v/d, fevers, chills, neck stiffness, visual changes, or problems breathing.  Mainly sense of disequilibrium.  Recommended that patient take benadryl and rest throughout the day as symptoms like these are most often related to URI.  Instructed patient that, if any of the aforementioned symptoms appear, she would need to be evaluated in urgent care or the ER.  Recommended that, if she does not feel better by tomorrow, go ahead and go to urgent care.

## 2011-07-07 ENCOUNTER — Emergency Department (HOSPITAL_COMMUNITY)
Admission: EM | Admit: 2011-07-07 | Discharge: 2011-07-07 | Disposition: A | Discharge status: Home / Self Care | Payer: Self-pay | Attending: Emergency Medicine | Admitting: Emergency Medicine

## 2011-07-07 ENCOUNTER — Emergency Department (HOSPITAL_COMMUNITY): Payer: Self-pay

## 2011-07-07 ENCOUNTER — Other Ambulatory Visit: Payer: Self-pay

## 2011-07-07 ENCOUNTER — Encounter (HOSPITAL_COMMUNITY): Payer: Self-pay | Admitting: *Deleted

## 2011-07-07 DIAGNOSIS — H8110 Benign paroxysmal vertigo, unspecified ear: Secondary | ICD-10-CM | POA: Insufficient documentation

## 2011-07-07 DIAGNOSIS — F17200 Nicotine dependence, unspecified, uncomplicated: Secondary | ICD-10-CM | POA: Insufficient documentation

## 2011-07-07 LAB — URINALYSIS, ROUTINE W REFLEX MICROSCOPIC
Glucose, UA: NEGATIVE mg/dL
Hgb urine dipstick: NEGATIVE
Ketones, ur: NEGATIVE mg/dL
Protein, ur: NEGATIVE mg/dL

## 2011-07-07 LAB — POCT I-STAT, CHEM 8
BUN: 11 mg/dL (ref 6–23)
Potassium: 4.1 mEq/L (ref 3.5–5.1)
Sodium: 139 mEq/L (ref 135–145)
TCO2: 24 mmol/L (ref 0–100)

## 2011-07-07 LAB — PREGNANCY, URINE: Preg Test, Ur: NEGATIVE

## 2011-07-07 MED ORDER — MECLIZINE HCL 25 MG PO TABS
25.0000 mg | ORAL_TABLET | Freq: Once | ORAL | Status: AC
  Administered 2011-07-07: 25 mg via ORAL
  Filled 2011-07-07: qty 1

## 2011-07-07 MED ORDER — MECLIZINE HCL 25 MG PO TABS: 25.0000 mg | ORAL_TABLET | Freq: Three times a day (TID) | ORAL | Status: DC | PRN

## 2011-07-07 NOTE — ED Notes (Signed)
Reports having dizziness that started yesterday morning with nausea, denies vomiting.

## 2011-07-07 NOTE — ED Provider Notes (Signed)
History     CSN: [id number]  Arrival date & time 07/07/11  1478   First MD Initiated Contact with Patient 07/07/11 1055      Chief Complaint  Patient presents with  . Dizziness    (Consider location/radiation/quality/duration/timing/severity/associated sxs/prior treatment) HPI Patient is a 36 year old female who presents today complaining of having dizziness since 9 AM yesterday. Patient reports that this is though the room is moving around her. Patient has had this previously but it is gone away. Patient does not have any ringing sensation or ears. She denies any numbness, tingling, weakness, or changes in her vision or hearing. Patient denies any nausea or vomiting. She also endorses some lightheadedness. This is different from the dizziness she describes. Her dizziness is worse with changes in position. She's not been seen by Dr. for this in the past. She denies any fevers with this. Patient seemed dynamically stable on presentation. She did report having some recent heavy vaginal bleeding. She does not have history of anemia. She has had no chest pain, shortness of breath, or palpitations with her symptoms. Movement makes this worse and nothing has made it better.There are no other associated or modifying factors.  History reviewed. No pertinent past medical history.  Past Surgical History  Procedure Date  . Tubal ligation     Family History  Problem Relation Age of Onset  . Cancer Mother     mother breast ca at 67 yoa, and colon ca at 59 yoa    History  Substance Use Topics  . Smoking status: Current Everyday Smoker -- 0.5 packs/day    Types: Cigarettes  . Smokeless tobacco: Not on file  . Alcohol Use: No    OB History    Grav Para Term Preterm Abortions TAB SAB Ect Mult Living                  Review of Systems  Constitutional: Negative.   HENT: Negative.   Eyes: Negative.   Respiratory: Negative.   Cardiovascular: Negative.   Gastrointestinal: Negative.     Genitourinary: Negative.   Musculoskeletal: Negative.   Skin: Negative.   Neurological: Positive for dizziness and light-headedness.  Hematological: Negative.   Psychiatric/Behavioral: Negative.   All other systems reviewed and are negative.    Allergies  Review of patient's allergies indicates no known allergies.  Home Medications   Current Outpatient Rx  Name Route Sig Dispense Refill  . [facility] MOTION SICKNESS PO Oral Take 1-2 tablets by mouth every 4 (four) hours as needed. To help with dizziness.      BP 114/75  Pulse 73  Temp(Src) 98.5 F (36.9 C) (Oral)  Resp 18  SpO2 96%  LMP 06/26/2011  Physical Exam  Nursing note and vitals reviewed. GEN: Well-developed, well-nourished female in no distress HEENT: Atraumatic, normocephalic. Oropharynx clear without erythema EYES: PERRLA BL, no scleral icterus. NECK: Trachea midline, no meningismus CV: regular rate and rhythm. No murmurs, rubs, or gallops PULM: No respiratory distress.  No crackles, wheezes, or rales. GI: soft, non-tender. No guarding, rebound, or tenderness. + bowel sounds  Neuro: cranial nerves 2-12 intact, no abnormalities of strength or sensation, A and O x 3, no difficulty with finger to nose task bilaterally. No pronator drift. Patient had worsening of her symptoms with Dix-Hallpike maneuver. No nystagmus was noted. MSK: Patient moves all 4 extremities symmetrically, no deformity, edema, or injury noted Psych: no abnormality of mood   ED Course  Procedures (including critical care time)  Date: 07/07/2011  Rate: 61  Rhythm: normal sinus rhythm  QRS Axis: normal  Intervals: normal  ST/T Wave abnormalities: nonspecific T wave changes  Conduction Disutrbances:none  Narrative Interpretation:   Old EKG Reviewed: none available   Labs Reviewed  URINALYSIS, ROUTINE W REFLEX MICROSCOPIC  POCT I-STAT, CHEM 8  PREGNANCY, URINE   Ct Head Wo Contrast  07/07/2011  *RADIOLOGY REPORT*  Clinical Data:  Dizziness  CT HEAD WITHOUT CONTRAST  Technique:  Contiguous axial images were obtained from the base of the skull through the vertex without contrast.  Comparison: None.  Findings: No skull fracture is noted.  Paranasal sinuses and mastoid air cells are unremarkable.  No intracranial hemorrhage, mass effect or midline shift.  No acute infarction.  No hydrocephalus.  No mass lesion is noted on this unenhanced scan.  No intra or extra-axial fluid collection.  IMPRESSION: No acute intracranial abnormality.  Original Report Authenticated By: Natasha Mead, M.D.     1. BPPV (benign paroxysmal positional vertigo)       MDM  Patient was evaluated. She had normal hemodynamics and had had her symptoms for over 24 hours. Patient had a head CT given that she has not had imaging of her head in the past for this. This was unremarkable. EKG also showed nonspecific T-wave inversions in leads V2 and V3 but was otherwise normal. Patient had denied any chest pain or palpitations with her symptoms. Patient had an i-STAT checked to evaluate for significant dehydration or anemia. This was also unremarkable. Patient did have symptoms suggestive of a BPPV. While in the emergency department she was treated with meclizine. She had no ear pain or auditory symptoms to suggest a labyrinthitis or Mnire's disease.  Patient was not pregnant and did not have any signs of urinary tract infection. Her CT was negative. She did not have significant dehydration or anemia. Patient did have some improvement with meclizine. She no longer had symptoms while lying still. While symptoms returned when she sat up these did improve shortly after sitting up. Patient was able to angulate. She was given a second dose of meclizine will be discharged with this. Patient was instructed to return if she has worsening of her symptoms or other emergent concerns.        Cyndra Numbers, MD 07/07/11 [phone]

## 2011-12-10 ENCOUNTER — Encounter: Payer: Self-pay | Admitting: Sports Medicine

## 2011-12-10 ENCOUNTER — Ambulatory Visit (INDEPENDENT_AMBULATORY_CARE_PROVIDER_SITE_OTHER): Payer: Self-pay | Admitting: Sports Medicine

## 2011-12-10 ENCOUNTER — Telehealth: Payer: Self-pay | Admitting: Family Medicine

## 2011-12-10 VITALS — BP 106/84 | Temp 98.3°F | Wt 178.0 lb

## 2011-12-10 DIAGNOSIS — H8110 Benign paroxysmal vertigo, unspecified ear: Secondary | ICD-10-CM

## 2011-12-10 MED ORDER — MECLIZINE HCL 25 MG PO TABS: 25.0000 mg | ORAL_TABLET | Freq: Three times a day (TID) | ORAL | Status: DC | PRN

## 2011-12-10 NOTE — Patient Instructions (Addendum)
It was nice to meet you. I have refilled your meclizine.  I've also put in a referral for physical therapy for them to be able to evaluate and treat.  You will hear from their facility in can discussing options at that time.  I strongly encourage you to pursue this route as physical therapy is a better treatment than medicine.  Please follow up as needed.

## 2011-12-10 NOTE — Telephone Encounter (Signed)
Patient states since the weather has gotten hotter she is having more problems  with vertigo and dizziness  and having to take the meclizine she was given in Feb more frequently. Wants refill . Appointment scheduled today for appointment .

## 2011-12-10 NOTE — Telephone Encounter (Signed)
Pt was seen at ED in Feb and she is asking for refill on meclizine - she knows she might have to make an appt,but is wanting to talk to nurse first.

## 2011-12-11 NOTE — Assessment & Plan Note (Signed)
Evidence of BPPV on exam.  Will refill meclizine as has provided significant relief.  No evidence of hearing impairment which is reassuring for Mnire's disease. We'll refer patient for physical therapy evaluation and treatment of BPPV and canalith repositioning

## 2011-12-11 NOTE — Progress Notes (Signed)
  [facility]  Patient name: [name] MRN [id number]  Date of birth: 02/14/1976  CC & HPI  [name] is a 36 y.o. female presenting today for follow-up of vertigo.  Patient was seen in the emergency department earlier this year, diagnosed with vertigo following thorough workup including CT scan.  She reports occasional vertiginous like symptoms where the room appears to be spending.  When this happens she has to lay down.  It occurs infrequently, approximately 2-3 times per month and lasts from 1-2 days.  She was given meclizine while in the emergency department and a prescription for this to take home.  She reports this medication is significantly helpful and wishes for a refill at this time.   ROS  Patient reports some nausea but no vomiting, no shortness breath or chest pain, no unilateral weakness her slurring of speech.  No changes in her hearing or vision  Pertinent History Reviewed  Medical & Surgical Hx:  Reviewed: Significant for hyperlipidemia chronic low back pain Medications: Reviewed & Updated - see associated section Social History: Reviewed - Significant for positive every day smoker  Objective Findings  Vitals:  Filed Vitals:   12/10/11 1559  BP: 106/84  Temp: 98.3 F (36.8 C)   PE: GENERAL:  Adult Caucasian female.  Examined in [facility].  In no discomfort; norespiratory distress.   PSYCH: Alert and appropriately interactive; Insight:Good   H&N: AT/[place], MMM, no scleral icterus, EOMi EXTREMITIES: Moves all 4 extremities spontaneously, warm well perfused, no edema, Neuro: Cranial nerves II through XII intact, no lateralization of extremities, upper and lower extremity myotomes  5+/5.  Mildly positive Positive Dix-Hallpike bilaterally

## 2012-02-14 ENCOUNTER — Encounter (HOSPITAL_COMMUNITY): Payer: Self-pay

## 2012-02-14 ENCOUNTER — Ambulatory Visit: Payer: Self-pay

## 2012-02-14 ENCOUNTER — Emergency Department (INDEPENDENT_AMBULATORY_CARE_PROVIDER_SITE_OTHER)
Admission: EM | Admit: 2012-02-14 | Discharge: 2012-02-14 | Disposition: A | Discharge status: Home / Self Care | Payer: Self-pay | Source: Home / Self Care

## 2012-02-14 ENCOUNTER — Telehealth: Payer: Self-pay | Admitting: *Deleted

## 2012-02-14 DIAGNOSIS — H8110 Benign paroxysmal vertigo, unspecified ear: Secondary | ICD-10-CM

## 2012-02-14 HISTORY — DX: Dizziness and giddiness: R42

## 2012-02-14 NOTE — ED Provider Notes (Signed)
History     CSN: [id number]  Arrival date & time 02/14/12  1238   None     Chief Complaint  Patient presents with  . Dizziness    (Consider location/radiation/quality/duration/timing/severity/associated sxs/prior treatment) HPI Comments: 36 year old female with a history of benign positional vertigo was at work today when she  experienced an acute onset of dizziness, vertigo, the room was spinning, and associated with feeling cold, nausea and headache. She states this is typical of her previous vertigo episodes however the feeling of coldness and sweating was not a part of her previous experience, this is what brought her to the urgent care. He denies other associated neurologic complaints. She denies upper respiratory symptoms. She took one of her meclizine tablets and began to improve within an hour. She is feeling significantly better now as her dizziness has abated and she primarily feels cold now and still has a headache.   Past Medical History  Diagnosis Date  . Vertigo     Past Surgical History  Procedure Date  . Tubal ligation     Family History  Problem Relation Age of Onset  . Cancer Mother     mother breast ca at 80 yoa, and colon ca at 6 yoa    History  Substance Use Topics  . Smoking status: Current Every Day Smoker -- 0.5 packs/day    Types: Cigarettes  . Smokeless tobacco: Not on file  . Alcohol Use: No    OB History    Grav Para Term Preterm Abortions TAB SAB Ect Mult Living                  Review of Systems  Constitutional: Positive for chills and activity change. Negative for fever and fatigue.  Eyes: Negative.   Respiratory: Negative.   Cardiovascular: Negative.   Gastrointestinal: Positive for nausea. Negative for vomiting.  Musculoskeletal: Negative.   Neurological: Positive for dizziness, light-headedness and headaches. Negative for tremors, seizures, syncope, facial asymmetry, speech difficulty, weakness and numbness.    Psychiatric/Behavioral: Negative.     Allergies  Review of patient's allergies indicates no known allergies.  Home Medications   Current Outpatient Rx  Name Route Sig Dispense Refill  . [facility] MOTION SICKNESS PO Oral Take 1-2 tablets by mouth every 4 (four) hours as needed. To help with dizziness.      BP 118/87  Pulse 86  Temp 98.8 F (37.1 C) (Oral)  Resp 16  SpO2 100%  Physical Exam  Constitutional: She is oriented to person, place, and time. She appears well-developed and well-nourished. No distress.  HENT:  Head: Normocephalic.  Right Ear: External ear normal.  Left Ear: External ear normal.  Nose: Nose normal.  Mouth/Throat: Oropharynx is clear and moist. No oropharyngeal exudate.  Eyes: Conjunctivae normal and EOM are normal. Pupils are equal, round, and reactive to light. Right eye exhibits no discharge.  Neck: Neck supple.  Cardiovascular: Normal rate, regular rhythm and normal heart sounds.   No murmur heard. Pulmonary/Chest: Effort normal and breath sounds normal. No respiratory distress. She has no wheezes.  Musculoskeletal: Normal range of motion. She exhibits no edema and no tenderness.  Lymphadenopathy:    She has no cervical adenopathy.  Neurological: She is alert and oriented to person, place, and time. She has normal strength. She displays no atrophy and no tremor. No cranial nerve deficit or sensory deficit. She exhibits normal muscle tone. She displays a negative Romberg sign. Coordination and gait normal.  Tandem gait is normal  Skin: She is not diaphoretic.    ED Course  Procedures (including critical care time)  Labs Reviewed - No data to display No results found.   1. Benign paroxysmal positional vertigo       MDM  Reassurance. Continue taking meclizine 3 times a day when necessary dizziness. Do not go back to work today and stay home rest drink plenty of fluids. May follow up with your doct. He may return for worsening or as  needed        Hayden Rasmussen, NP 02/14/12 1414

## 2012-02-14 NOTE — ED Provider Notes (Signed)
Medical screening examination/treatment/procedure(s) were performed by non-physician practitioner and as supervising physician I was immediately available for consultation/collaboration.  Darling Cieslewicz   Kanden Carey, MD 02/14/12 1518 

## 2012-02-14 NOTE — ED Notes (Signed)
C/o dizziness, slight nausea and headache, started at 11:30 am

## 2012-02-14 NOTE — Telephone Encounter (Signed)
Patient calling due to having dizziness.  Has been dx'd with vertigo and takes meclizine.  Patient took meclizine and c/o feeling "sweaty, shaky, and dizzy."  Did not eat breakfast this morning and does not have hx of hypoglycemia.  Patient is at work now and is going to eat a "honeybun" to see if it helps with her symptoms.  Does not have insurance and requesting appt to be seen in our office today.  Appt scheduled with overflow clinic for today at 1:45pm.  Patient will have her mother pick her up from work and bring her to our office.  Gaylene Brooks, RN

## 2012-05-08 ENCOUNTER — Other Ambulatory Visit: Payer: Self-pay | Admitting: Family Medicine

## 2012-05-08 DIAGNOSIS — R5383 Other fatigue: Secondary | ICD-10-CM

## 2012-05-25 ENCOUNTER — Other Ambulatory Visit: Payer: Self-pay | Admitting: Family Medicine

## 2012-05-25 DIAGNOSIS — H8110 Benign paroxysmal vertigo, unspecified ear: Secondary | ICD-10-CM

## 2012-05-25 NOTE — Telephone Encounter (Signed)
Patient is calling for a refill on Meclizine to treat her Vertigo.  [facility] [address].

## 2012-05-28 MED ORDER — MECLIZINE HCL 25 MG PO TABS: 25.0000 mg | ORAL_TABLET | Freq: Three times a day (TID) | ORAL | Status: DC | PRN

## 2012-05-28 NOTE — Telephone Encounter (Signed)
Called patient and informed her of below.

## 2012-05-28 NOTE — Telephone Encounter (Signed)
Gave meclizine 30.  Needs appointment with Dr. Aviva Signs.  Will not be filling again without being seen.  Can discuss referral to PT

## 2012-05-28 NOTE — Telephone Encounter (Signed)
Spoke with patient and she states that she is still having the vertigo and was not able to go through with the PT referral due to payment issues. Patient is wanting the refill instead of having to come in. She is not taking it as PRN she is taking it every single day to prevent episodes even when she feels fine. FWD to Dr. Berline Chough, he is the one who prescribed this medication and seen her for this.

## 2012-05-28 NOTE — Telephone Encounter (Signed)
Patient is calling back to check the status on this refill.  She would like someone to call her back.

## 2012-05-29 NOTE — Telephone Encounter (Signed)
Agree with Dr. Berline Chough. Pt needs appointment to evaluate condition.

## 2012-06-08 ENCOUNTER — Ambulatory Visit: Payer: Self-pay | Admitting: Family Medicine

## 2012-06-08 ENCOUNTER — Encounter: Payer: Self-pay | Admitting: Family Medicine

## 2012-06-08 ENCOUNTER — Ambulatory Visit (INDEPENDENT_AMBULATORY_CARE_PROVIDER_SITE_OTHER): Payer: Self-pay | Admitting: Family Medicine

## 2012-06-08 VITALS — BP 113/78 | HR 79 | Ht 61.5 in | Wt 184.0 lb

## 2012-06-08 DIAGNOSIS — H8110 Benign paroxysmal vertigo, unspecified ear: Secondary | ICD-10-CM

## 2012-06-08 DIAGNOSIS — E669 Obesity, unspecified: Secondary | ICD-10-CM

## 2012-06-08 LAB — TSH: TSH: 1.493 u[IU]/mL (ref 0.350–4.500)

## 2012-06-08 MED ORDER — MECLIZINE HCL 25 MG PO TABS: 25.0000 mg | ORAL_TABLET | Freq: Three times a day (TID) | ORAL | Status: AC | PRN

## 2012-06-08 NOTE — Assessment & Plan Note (Signed)
Patient has been struggling with weight gain, hair loss, decreased energy, and fatigue.  Denies depressed mood. - Plan to check TSH today and notify of results - Discussed referral to Dr. Gerilyn Pilgrim, patient will think about it - Discussed healthy eating habits (3 meals, 2 snacks per day) - Follow up with PCP in 4 weeks or sooner PRN

## 2012-06-08 NOTE — Assessment & Plan Note (Addendum)
Patient says Meclizine is helping, but she needs a refill.  She does not have insurance is unsure about pursuing PT because it may not help.  She does not want to spend money on PT if it might not help symptoms. - Will refill Meclizine x 3 months - I spoke to [facility], to be seen by therapist there, patient will need a referral to ENT and be seen by a doctor - Will order PT (not through ENT) - Patient to follow up with PCP in 1 month or sooner as needed, PCP can decide if she would benefit from ENT referral

## 2012-06-08 NOTE — Progress Notes (Signed)
  Subjective:    Patient ID: [name], female    DOB: Jan 24, 1976, 37 y.o.   MRN: [id number]  HPI  Patient here to discuss weight gain and hair loss.  Weight gain has been steady, does not weigh on a scale, but clothes are getting tighter.  Does not exercise due to hx of vertigo.  She does not eat much - typically has oatmeal for breakfast, skips lunch, eats chips as mid-afternoon snack, and then dinner.  She has gained about 6 lb in 6 months despite trying multiple diets.  Hair loss has worsened in the last few months, she has noticed her shower drain plugging more often.  Patient also complains of difficulty sleeping at night and fatigue during the day and decreased energy.  Symptoms have been going on since September.  At that time, she stopped drinking [facility] and noticed a decrease in energy.  She has thyroid check in the past, but requesting TSH to be checked today.  She denies any depressed mood - denies any SI/HI thoughts.  Review of Systems  Per HPI    Objective:   Physical Exam  Constitutional: She appears well-nourished. No distress.  Cardiovascular: Normal rate, regular rhythm and normal heart sounds.   Pulmonary/Chest: Effort normal and breath sounds normal. She has no wheezes. She has no rales.  Abdominal: Soft. She exhibits no distension. There is no tenderness.  Neurological: She is alert. No cranial nerve deficit.  Skin: Skin is warm. No rash noted.  Psychiatric: She has a normal mood and affect. Her speech is normal and behavior is normal.      Assessment & Plan:

## 2012-06-08 NOTE — Patient Instructions (Addendum)
Nice to meet you today, [name]. We will call or send you a result letter. A referral for PT will be made when I find out which program to send you to for vertigo. In the meantime, I will refill your Meclizine. Schedule follow up appointment with Dr. Aviva Signs in 4 weeks or sooner as needed.

## 2012-12-25 ENCOUNTER — Other Ambulatory Visit: Payer: Self-pay | Admitting: Family Medicine

## 2013-01-04 ENCOUNTER — Telehealth: Payer: Self-pay | Admitting: Family Medicine

## 2013-01-04 ENCOUNTER — Other Ambulatory Visit: Payer: Self-pay | Admitting: Family Medicine

## 2013-01-04 NOTE — Telephone Encounter (Signed)
The patient is calling to speak to her doctor, she does not want to speak to the nurse.  This is about the medication she is taking.  This is about the Medlizine that we just received a refill request for.

## 2013-01-12 NOTE — Telephone Encounter (Signed)
Pt called and asked for more info. Would like to speak with Dr. Aviva Signs as to why it is difficult to get refills on antivert and why she must come into the office - pt reports that she does not have insurance or money to come in that often. Explained to pt that MD must follow up on condition and as to why this problem does not seem to be getting better. Pt verbalized understanding. Reports that she is taking one antivert a day and it seems to help - informed pt that refill had been called into pharmacy. Pt verbalized understanding. Wyatt Haste, RN-BSN

## 2013-01-12 NOTE — Telephone Encounter (Signed)
Has this been addressed?

## 2013-01-17 ENCOUNTER — Telehealth: Payer: Self-pay | Admitting: Family Medicine

## 2013-01-17 NOTE — Telephone Encounter (Signed)
Patient called emergency line.  States is having itching and has little bumps on hands. Husband has this as well. Her kids do not at this time. States 2 weeks ago was at [employer] and this started shortly afterwards. Thinks has scabies. Advised to use benadryl as directed on the bottle today and to call our office in the morning for an appointment to be checked out.  Marikay Alar, MD [facility]

## 2013-01-18 ENCOUNTER — Telehealth: Payer: Self-pay | Admitting: *Deleted

## 2013-01-18 NOTE — Telephone Encounter (Signed)
Pt would like advice on scabies. Dx with them over the weekend. Given instructions - verbalized understanding. Wyatt Haste, RN-BSN

## 2013-07-14 ENCOUNTER — Other Ambulatory Visit: Payer: Self-pay | Admitting: Family Medicine

## 2014-01-12 ENCOUNTER — Other Ambulatory Visit: Payer: Self-pay | Admitting: *Deleted

## 2014-01-12 MED ORDER — MECLIZINE HCL 25 MG PO TABS: ORAL_TABLET | ORAL | Status: DC

## 2014-03-10 ENCOUNTER — Other Ambulatory Visit: Payer: Self-pay | Admitting: Family Medicine

## 2014-05-05 ENCOUNTER — Other Ambulatory Visit: Payer: Self-pay | Admitting: Family Medicine

## 2014-07-08 ENCOUNTER — Other Ambulatory Visit: Payer: Self-pay | Admitting: Family Medicine

## 2014-08-01 ENCOUNTER — Other Ambulatory Visit: Payer: Self-pay

## 2014-08-01 DIAGNOSIS — Z1231 Encounter for screening mammogram for malignant neoplasm of breast: Secondary | ICD-10-CM

## 2014-08-04 ENCOUNTER — Ambulatory Visit
Admission: RE | Admit: 2014-08-04 | Discharge: 2014-08-04 | Disposition: A | Discharge status: Home / Self Care | Payer: No Typology Code available for payment source | Source: Ambulatory Visit

## 2014-08-04 ENCOUNTER — Encounter (INDEPENDENT_AMBULATORY_CARE_PROVIDER_SITE_OTHER): Payer: Self-pay

## 2014-08-04 DIAGNOSIS — Z1231 Encounter for screening mammogram for malignant neoplasm of breast: Secondary | ICD-10-CM

## 2014-08-12 ENCOUNTER — Other Ambulatory Visit (HOSPITAL_COMMUNITY)
Admission: RE | Admit: 2014-08-12 | Discharge: 2014-08-12 | Disposition: A | Discharge status: Home / Self Care | Payer: Self-pay | Source: Ambulatory Visit | Attending: Family Medicine | Admitting: Family Medicine

## 2014-08-12 ENCOUNTER — Encounter: Payer: Self-pay | Admitting: Family Medicine

## 2014-08-12 ENCOUNTER — Ambulatory Visit (INDEPENDENT_AMBULATORY_CARE_PROVIDER_SITE_OTHER): Payer: Self-pay | Admitting: Family Medicine

## 2014-08-12 VITALS — BP 118/86 | HR 83 | Temp 98.2°F | Ht 62.0 in | Wt 170.6 lb

## 2014-08-12 DIAGNOSIS — Z01419 Encounter for gynecological examination (general) (routine) without abnormal findings: Secondary | ICD-10-CM | POA: Insufficient documentation

## 2014-08-12 DIAGNOSIS — Z1151 Encounter for screening for human papillomavirus (HPV): Secondary | ICD-10-CM | POA: Insufficient documentation

## 2014-08-12 DIAGNOSIS — Z0000 Encounter for general adult medical examination without abnormal findings: Secondary | ICD-10-CM | POA: Insufficient documentation

## 2014-08-12 DIAGNOSIS — F17200 Nicotine dependence, unspecified, uncomplicated: Secondary | ICD-10-CM

## 2014-08-12 DIAGNOSIS — Z720 Tobacco use: Secondary | ICD-10-CM

## 2014-08-12 DIAGNOSIS — Z124 Encounter for screening for malignant neoplasm of cervix: Secondary | ICD-10-CM

## 2014-08-12 DIAGNOSIS — H8110 Benign paroxysmal vertigo, unspecified ear: Secondary | ICD-10-CM

## 2014-08-12 NOTE — Assessment & Plan Note (Signed)
Patient had a recent mammogram which was negative.  Pap smear performed today. Patient declined influenza and Tdap vaccines. I offer genetic counseling to the patient given her family history of breast cancer. She also declined this. Patient will need colonoscopy at 8240 given family history of colon cancer.

## 2014-08-12 NOTE — Patient Instructions (Signed)
It was nice to see you today.  If you change your mind about genetic counseling, tetanus shot, or referral to physical therapy please let me know.    Follow-up annually  Take care  Dr. Adriana Simasook.

## 2014-08-12 NOTE — Assessment & Plan Note (Signed)
I offered the patient referral to neuro vestibular rehabilitation.  She declined. I advised her not to use meclizine daily.

## 2014-08-12 NOTE — Progress Notes (Addendum)
   Subjective:    Patient ID: Erin RinksElsie M Russell, female    DOB: 1975-10-01, 39 y.o.   MRN: [id number]  HPI 39 year old female with a history of hyperlipidemia, BPPV, and tobacco abuse presents for an annual physical exam.  Current complaints:  1) Vertigo  Patient has long-standing history of BPPV.  Recently she had 2 episodes in February. These were characterized by "room spinning "nausea and feeling hot.  She stated that her symptoms occurred for approximately 2 days. She had improvement with meclizine.  No associated tinnitus, visual difficulties.  Patient states that she is feeling well currently.  As a result of her frequent episodes of BPPV she takes meclizine on a daily basis.  2) Preventative care/Gyn concerns  Regular periods: Yes.   Heavy bleeding: No.   Sexually active: Yes.   Contraception or hormonal therapy: No.   Hx of STD: No.   Dyspareunia: No.  Hot flashes: No.  Vaginal discharge: No.  Dysuria:No.  Pap smear - last Pap smear was 2011 and was normal.  Mammogram - patient has a significant family history of breast cancer (mother was diagnosed with breast cancer at 4135). She had a recent mammogram which was negative.    Immunizations - In need of Tdap/Influenza   PMH, Surgical Hx, Family Hx, Social History reviewed and updated as below.  Past Medical History  Diagnosis Date  . Vertigo    Past Surgical History  Procedure Laterality Date  . Tubal ligation     Family History  Problem Relation Age of Onset  . Cancer Mother     mother breast ca at 2335 yoa, and colon ca at 2550 yoa   History   Social History  . Marital Status: Single    Spouse Name: N/A  . Number of Children: N/A  . Years of Education: N/A   Social History Main Topics  . Smoking status: Current Every Day Smoker -- 0.50 packs/day    Types: Cigarettes  . Smokeless tobacco: Not on file  . Alcohol Use: No  . Drug Use: No  . Sexual Activity: Not on file   Other Topics Concern   . None   Social History Narrative   Review of Systems General: no fatigue, weight loss, fever. Patient does note hot flashes. Respiratory: No shortness of breath Cardiac: No chest pain. Neuro: Patient has mild intermittent headaches. He also has vertigo as indicated above. No vision changes, hearing loss or tinnitus.     Objective:   Physical Exam Filed Vitals:   08/12/14 1347  BP: 118/86  Pulse: 83  Temp: 98.2 F (36.8 C)   Exam: General: well appearing, NAD. HEENT: NCAT. Normal TMs bilaterally. Oropharynx clear. Cardiovascular: RRR. No murmurs, rubs, or gallops. Respiratory: CTAB. No rales, rhonchi, or wheeze. Abdomen: soft, nontender, nondistended. Pelvic Exam:        External: normal female genitalia without lesions or masses        Vagina: normal without lesions or masses        Cervix: normal without lesions or masses        Adnexa: normal bimanual exam without masses or fullness        Uterus: normal by palpation        Pap smear: performed    Assessment & Plan:  See Problem List

## 2014-08-12 NOTE — Assessment & Plan Note (Signed)
Discussed tobacco cessation. Patient not interested at this time.

## 2014-08-15 LAB — CYTOLOGY - PAP

## 2014-08-16 ENCOUNTER — Telehealth: Payer: Self-pay | Admitting: *Deleted

## 2014-08-16 NOTE — Telephone Encounter (Signed)
-----   Message from Tommie SamsJayce G Cook, DO sent at 08/15/2014  4:58 PM EDT ----- Please inform of negative pap smear.  Thanks  United Technologies CorporationJayce Cook DO

## 2014-08-16 NOTE — Telephone Encounter (Signed)
Called pt an informed her of below. [name], April D

## 2014-09-05 ENCOUNTER — Other Ambulatory Visit: Payer: Self-pay | Admitting: Family Medicine

## 2014-11-04 ENCOUNTER — Other Ambulatory Visit: Payer: Self-pay | Admitting: Family Medicine

## 2015-01-04 ENCOUNTER — Encounter (HOSPITAL_COMMUNITY): Payer: Self-pay

## 2015-01-04 ENCOUNTER — Emergency Department (HOSPITAL_COMMUNITY): Payer: No Typology Code available for payment source

## 2015-01-04 ENCOUNTER — Emergency Department (HOSPITAL_COMMUNITY)
Admission: EM | Admit: 2015-01-04 | Discharge: 2015-01-04 | Disposition: A | Discharge status: Home / Self Care | Payer: No Typology Code available for payment source | Attending: Emergency Medicine | Admitting: Emergency Medicine

## 2015-01-04 DIAGNOSIS — S99912A Unspecified injury of left ankle, initial encounter: Secondary | ICD-10-CM | POA: Diagnosis present

## 2015-01-04 DIAGNOSIS — Y990 Civilian activity done for income or pay: Secondary | ICD-10-CM | POA: Insufficient documentation

## 2015-01-04 DIAGNOSIS — S82892A Other fracture of left lower leg, initial encounter for closed fracture: Secondary | ICD-10-CM

## 2015-01-04 DIAGNOSIS — Y92513 Shop (commercial) as the place of occurrence of the external cause: Secondary | ICD-10-CM | POA: Diagnosis not present

## 2015-01-04 DIAGNOSIS — X58XXXA Exposure to other specified factors, initial encounter: Secondary | ICD-10-CM | POA: Diagnosis not present

## 2015-01-04 DIAGNOSIS — Z720 Tobacco use: Secondary | ICD-10-CM | POA: Diagnosis not present

## 2015-01-04 DIAGNOSIS — S8262XA Displaced fracture of lateral malleolus of left fibula, initial encounter for closed fracture: Secondary | ICD-10-CM | POA: Insufficient documentation

## 2015-01-04 DIAGNOSIS — Y9389 Activity, other specified: Secondary | ICD-10-CM | POA: Insufficient documentation

## 2015-01-04 MED ORDER — OXYCODONE-ACETAMINOPHEN 5-325 MG PO TABS: 2.0000 | ORAL_TABLET | ORAL | Status: DC | PRN

## 2015-01-04 MED ORDER — MORPHINE SULFATE 4 MG/ML IJ SOLN
4.0000 mg | INTRAMUSCULAR | Status: DC | PRN
  Administered 2015-01-04: 4 mg via INTRAVENOUS
  Filled 2015-01-04: qty 1

## 2015-01-04 MED ORDER — ONDANSETRON HCL 4 MG/2ML IJ SOLN
4.0000 mg | Freq: Once | INTRAMUSCULAR | Status: AC
  Administered 2015-01-04: 4 mg via INTRAVENOUS
  Filled 2015-01-04: qty 2

## 2015-01-04 NOTE — Discharge Instructions (Signed)
Please do not bear weight on your left leg. Please ambulate with crutches. Keep leg elevated while at rest and ice as needed. Please call Dr. August Saucer from orthopedic surgery for outpatient follow-up regarding your ankle injury.  Ankle Fracture A fracture is a break in a bone. A cast or splint may be used to protect the ankle and heal the break. Sometimes, surgery is needed. HOME CARE  Use crutches as told by your doctor. It is very important that you use your crutches correctly.  Do not put weight or pressure on the injured ankle until told by your doctor.  Keep your ankle raised (elevated) when sitting or lying down.  Apply ice to the ankle:  Put ice in a plastic bag.  Place a towel between your cast and the bag.  Leave the ice on for 20 minutes, 2-3 times a day.  If you have a plaster or fiberglass cast:  Do not try to scratch under the cast with any objects.  Check the skin around the cast every day. You may put lotion on red or sore areas.  Keep your cast dry and clean.  If you have a plaster splint:  Wear the splint as told by your doctor.  You can loosen the elastic around the splint if your toes get numb, tingle, or turn cold or blue.  Do not put pressure on any part of your cast or splint. It may break. Rest your plaster splint or cast only on a pillow the first 24 hours until it is fully hardened.  Cover your cast or splint with a plastic bag during showers.  Do not lower your cast or splint into water.  Take medicine as told by your doctor.  Do not drive until your doctor says it is safe.  Follow-up with your doctor as told. It is very important that you go to your follow-up visits. GET HELP IF: The swelling and discomfort gets worse.  GET HELP RIGHT AWAY IF:   Your splint or cast breaks.  You continue to have very bad pain.  You have new pain or swelling after your splint or cast was put on.  Your skin or toes below the injured ankle:  Turn blue or  gray.  Feel cold, numb, or you cannot feel them.  There is a bad smell or yellowish white fluid (pus) coming from under the splint or cast. MAKE SURE YOU:   Understand these instructions.  Will watch your condition.  Will get help right away if you are not doing well or get worse. Document Released: 03/10/2009 Document Revised: 03/03/2013 Document Reviewed: 12/10/2012 [facility] Patient Information 2015 [place], [place]. This information is not intended to replace advice given to you by your health care provider. Make sure you discuss any questions you have with your health care provider.

## 2015-01-04 NOTE — ED Notes (Signed)
Bed: WA06 Expected date:  Expected time:  Means of arrival:  Comments: EMS 39 yo female fall at work-deformity left ankle

## 2015-01-04 NOTE — ED Notes (Addendum)
Pt transported by [facility].  Pt fell from standing position on top of shopping cart approx 30 min ago.  Pt has left ankle deformity.  No other injuries reported. Fentanyl given en route.

## 2015-01-04 NOTE — ED Notes (Signed)
Pt waiting for ride.

## 2015-01-04 NOTE — ED Provider Notes (Signed)
CSN: [id number]     Arrival date & time 01/04/15  0654 History   First MD InD91Endosurg Outpatient CenteRuDeNFKend35[place] MedicDeNF50Norton HealthcSharene SkeSandria ManConneKoreact128-mHermDeNFHot74[facility] AssociWagoner DeNFArno6Field Memorial Community HosAtrium HSharene SkeSandria ManConneKoreact12-mHerminiKorEncD68Legacy Silverton HosPam Rehabilitation Hospital Of JapaVermont Eye Sharene SDeNF41Llano Specialty HDeNFHav62Sgmc Lanier CSt Mary'S Good Samaritan HospiMontourJapaLaser AndSharene SkeSandria ManConneKoreact732-mHermDeNFWi41Surgery Center Of Mount DorBarnet Dulaney Perkins Eye CenDeNF30Roy A Himelfarb Surgery CTuSharDeN55Baptist Health CSt. Lukes Des PeSharene DeNFHolly35O'Connor HosAvera Gregory HealthcaD71Limestone Medical CentePappas Rehabilitation HShareDeNFP27KatheSharene SkeSandria ManConneKoreact682-mHerminiKorAltus De59Northern New Jersey Center For Advanced EndoscopSt Anthony'S Rehabilitation Sharene SkeSandria ManConneKoreact759DeNFRidge Wood103Navicent Health BaOsf HealthcareDeNFCenDeNF13Premier Gastroenterology Associates Dba Premier Surgery CBaptist St. Anthony'S Health System - BapDeNF53United Hospital DisNorth Mississippi HealtDe23Surgicare Surgical Associates Of Jersey CitSurgicareDeNFD57Texas Health Surgery Center ISpectrum Health PShareneDeNFB69Windmoor Healthcare Of ClearMoSharene SkeSandria ManConneKoreact365-mHerminiKorGastroenterology Diagnostics Of NDeNFW48Marian Medical CChristus Santa Rosa Physicians Ambulatory SurgDeN80Spartanburg Hospital For RestorativeThe Corpus Christi Medical Center -Sharene D59May [address] Surgi CenteGallup Indian Medical CenMSharenDeNFPel13Surgicare Of SouthernDeNFNew68Coliseum Medical CeDothan Surgery CenterD58Blue [facility] Gnaden HuArise Austin MediDeNFIron Mount72East Ohio Regional HosHealtheast Surgery Center Maplewood WestJapaPaDeNFFra31Vibra Hospital Of Mahoning VYuma Regional Medical CenFeJapaCharloSharene SkeSandria MDeNFMacc58The Women'S Hospital At CentDeNFWes61Franklin Endoscopy CenteDiginity Health-St.Rose DominicaDe34Options BehavioraDeNFPopl5aProvidence Va Medical CenHeSurgery And Laser CenteSharene SkeSandria ManCoD33Seton Medical Center Harker HeDigestive Care EndoscSharene SkeSandria ManConneKoreactDeNFBluewater24Mt Carmel East HosMemorial Hermann Tomball HospiNorthJaSharene SkeSandria ManConneKoreact65-mHerminiKorUrological ClinDeNFGr65Hegg Memorial Health CVa Medical Center - DDeNFSix 66Mcdonald Army Community HosRedington-Fairview General HospiJapaSouthwest Medical Associates Sharene SkeSandria ManConneKoreact720-mHerminiKorTemecula Ca United Surgery Center LP Dba United Surgery Center TemeculaeaUniversity H50ospEyeDe67Rf Eye Pc Dba DeNFSouth Pl66Chi St Joseph Health Madison HosStDeNFL76Va North Florida/South Georgia Healthcare SyDeNFSe82Northlake Behavioral Health SLouisville Va MediSharene SkeSandriDeNF25College ParkDeN68Medical Eye AssociateSerraSharene SkeSandria ManConneKoreact555-mHerminiKorSan Leandro SurgeDeNF54Channel Islands SurgicentSelect Specialty Hsptl MilwauGuilford JapSharene SkeSandria ManConneKoreact873-mHerminiKorBaylor Surgicare At Plano ParkwayDeNF4DeNFM87Tyler Holmes Memorial HosVa Illiana Healthcare System - DanviRocDeNFSt69Mendota Mental Hlth InstLa Jolla Endoscopy CJapaRRo69m108Sharene SkeSandria ManConneD7Flaget Memorial HosNortheast Rehabilitation HospiUniversiDeNFY74Bradford Place Surgery And Laser CentComplex CarDeNFGr35Encompass Health Hospital Of RoundEastern NDe72Umm Shore Surgery CeParSharene SkeSandria ManConneKDeN17Lower Conee Community HosRuSharene SkeSaDeNF50Banner-University Medical Center South CNorthwest Kansas Surgery CenFJapaRutgersDeNFJohn65Putnam Community Medical CParkriDeNF51Garrison MemorDeN27Healthalliance Hospital - Broadway CPalm PoinSharene SkeSandria ManConneKorDe28Westside Surgery CenteStonegate Surgery CenterHomosassa SJaSharene SkeSandria ManDe73Pratt Regional Medical CSurgery Center Of Northern [place] Dba Eye Center DeNFSilver Spring31Community Hospital Of SanDeNFSharo50Palmerton HosAnn & Robert H Lurie Children'S Hospital Of ChicNortJSharene SkeSandri6DeNF42Sugarland Rehab HosUniversity Of Illinois HospiRuhenJapaSharene SkeSandriDeN52Aspirus Langlade HosLone Star Endoscopy Center SouthlUniversitJapaBaptist Hospitals OfSharene SkeSaDeNFSpr27Saint Luke'S Northland Hospital - SmithAdventist Health Sonora Regional Medical Center - FairvFoSharene SkeSandDeNFCastle19Doctors Center Hospital Sanfernando De CarAdvanced Surgery Center Of NorSDeNF61Select Specialty Hospital-Northeast OhioJfk Medical CenBayou LJapaOceans Sharene SkeSandria ManDeNFWe29Stony Point Surgery CSharene SkeSandria ManConneKoreact293-mHerminiKorIntermountain Hosp4itaW J Barge Memorial HospitZO741#100Arabella Merles654321unkern Lor Memorial H48o2581KPigeon ForgeReBridget HartshoClinton GalConnectic AG>ent) HPI 39 year old female, otherwise healthy, who presents with left ankle pain after fall. She has otherwise been in her usual state of health. 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 She was stepping on a boggy, and upon stepping down her left foot and twisted inward. She felt a crack, and was unable to walk after her injury. Has noted significant swelling to the lateral aspect of her left ankle. EMS was called and she was brought to the ED for further evaluation. She denies any head strike, LOC, or any other injuries. Denies any numbness or weakness in her extremity. Past Medical History  Diagnosis Date  . Vertigo   . Vertigo    Past Surgical History  Procedure Laterality Date  . Tubal ligation     Family History  Problem Relation Age of Onset  . Cancer Mother     mother breast ca at 35 yoa, and colon ca at 50 yoa   Social History  Substance Use Topics  . Smoking status: Current Every Day Smoker -- 0.50 packs/day    Types: Cigarettes  . Smokeless tobacco: None  . Alcohol Use: No   OB History    No data available     Review of Systems  Constitutional: Negative for fever and chills.  Eyes: Negative for visual disturbance.  Respiratory: Negative for shortness of breath.   Cardiovascular: Negative for chest pain.  Gastrointestinal: Negative for nausea and vomiting.  Musculoskeletal: Positive for joint swelling.  Allergic/Immunologic: Negative for immunocompromised state.  Neurological: Negative for syncope.  Hematological: Does not bruise/bleed easily.  Psychiatric/Behavioral: Negative for confusion.      Allergies  Review of patient's  allergies indicates no known allergies.  Home Medications   Prior to Admission medications   Medication Sig Start Date End Date Taking? Authorizing Provider  meclizine (ANTIVERT) 25 MG tablet TAKE 1 TABLET BY MOUTH 3 TIMES A DAY AS NEEDED FOR DIZZINESS Patient taking differently: TAKE 25 MG BY MOUTH 3 TIMES A DAY AS NEEDED FOR DIZZINESS 11/04/14  Yes Jayce G Cook, DO  oxyCODONE-acetaminophen (PERCOCET/ROXICET) 5-325 MG per tablet Take 2 tablets by mouth every 4 (four) hours as needed for severe pain. 01/04/15   Ajax Schroll Duo [place]ale Wherry, MD   BP 133/98 mmHg  Pulse 90  Temp(Src) 98.1 F (36.7 C) (Oral)  Resp 18  SpO2 99%  LMP 12/25/2014 Physical Exam Physical Exam  Nursing note and vitals reviewed. Constitutional: Well developed, well nourished, non-toxic, and in no acute distress Head: Normocephalic and atraumatic.  Mouth/Throat: Oropharynx is clear and moist.  Neck: Normal range of motion. Neck supple.  Cardiovascular: Normal rate and regular rhythm.   +2 DP pulses bilaterally. Less than 3 second capillary refill in bilateral lower extremities. Pulmonary/Chest: Effort normal and breath sounds normal.  Abdominal: Soft. There is no tenderness. There is no rebound and no guarding.  Musculoskeletal: Normal range of motion.  there is swelling noted over to the lateral malleolus of the left ankle. Range of motion is limited due to pain. Some tenderness also noted to the base of the fifth metatarsal. No tenderness to palpation over the proximal tibia/fibula.  Neurological: Alert, no  facial droop, fluent speech, moves all extremities symmetrically. intact sensation involving the common peroneal, deep peroneal, sural, ssaphenous, and superficial peroneal nerves. Full strength and large toe dorsi and plantar flexion of the left lower extremity.  Skin: Skin is warm and dry.  Psychiatric: Cooperative  ED Course  Procedures (including critical care time) Labs Review Labs Reviewed - No data to  display  Imaging Review Dg Ankle Complete Left  01/04/2015   CLINICAL DATA:  Twisting injury left ankle this morning. Left ankle pain and swelling. Initial encounter.  EXAM: LEFT ANKLE COMPLETE - 3+ VIEW  COMPARISON:  None.  FINDINGS: There is a small avulsion fracture off the the tip of the distal fibula with associated soft tissue swelling. There may also be a tiny avulsion off of the medial malleolus. No other fracture is identified.  IMPRESSION: Small avulsion fracture tip of the lateral malleolus. There may also be a very small avulsion fracture off the medial malleolus.   Electronically Signed   By: Drusilla Kanner M.D.   On: 01/04/2015 07:40   Dg Foot Complete Left  01/04/2015   CLINICAL DATA:  Twisted left ankle with lateral soft tissue swelling.  EXAM: LEFT FOOT - COMPLETE 3+ VIEW  COMPARISON:  Left ankle 01/04/2015  FINDINGS: There is an avulsion fracture along the distal tip of the lateral malleolus. Suspect a small avulsion fracture along the distal medial malleolus. Small spur involving the plantar aspect of the calcaneus. Alignment of the foot is normal.  IMPRESSION: Avulsion fractures involving the ankle. Please refer to the ankle exam from the same day.   Electronically Signed   By: Richarda Overlie M.D.   On: 01/04/2015 07:45     EKG Interpretation None      MDM   Final diagnoses:  Lateral malleolar fracture, left, closed, initial encounter  Avulsion fracture of ankle, left, closed, initial encounter    39 year old female who presents with left ankle pain after fall. Nontoxic in no acute distress on presentation with normal vital signs. She has evidence of swelling and significant pain involving the lateral malleolus of the left ankle. No other injuries are noted on exam. Extremity is neurovascularly intact. We'll obtain x-rays.  X-ray suggests avulsion fracture of the left lateral malleolus. Questionable avulsion of the medial malleolus. Placed in [place], and given  instructions on how to use crutches. Supportive care is discussed for at home. Patient is given contact information for Dr. August Saucer from orthopedic surgery and instructed to follow up within 1 week. Strict return instructions also reviewed. She expresses understanding of all discharge instructions for comfortable to plan of care.  Lavera Guise, MD 01/04/15 [phone]

## 2015-01-06 ENCOUNTER — Other Ambulatory Visit: Payer: Self-pay | Admitting: *Deleted

## 2015-01-09 MED ORDER — MECLIZINE HCL 25 MG PO TABS: ORAL_TABLET | ORAL | Status: DC

## 2015-03-09 ENCOUNTER — Other Ambulatory Visit: Payer: Self-pay | Admitting: Family Medicine

## 2015-05-01 ENCOUNTER — Other Ambulatory Visit: Payer: Self-pay | Admitting: Family Medicine

## 2015-07-04 ENCOUNTER — Other Ambulatory Visit: Payer: Self-pay | Admitting: Family Medicine

## 2015-07-31 ENCOUNTER — Other Ambulatory Visit (HOSPITAL_COMMUNITY)
Admission: RE | Admit: 2015-07-31 | Discharge: 2015-07-31 | Disposition: A | Discharge status: Home / Self Care | Payer: No Typology Code available for payment source | Source: Ambulatory Visit | Attending: Family Medicine | Admitting: Family Medicine

## 2015-07-31 ENCOUNTER — Ambulatory Visit (INDEPENDENT_AMBULATORY_CARE_PROVIDER_SITE_OTHER): Payer: Self-pay | Admitting: Family Medicine

## 2015-07-31 ENCOUNTER — Telehealth: Payer: Self-pay | Admitting: Family Medicine

## 2015-07-31 ENCOUNTER — Encounter: Payer: Self-pay | Admitting: Family Medicine

## 2015-07-31 VITALS — BP 125/79 | HR 90 | Temp 98.4°F | Ht 62.0 in | Wt 171.0 lb

## 2015-07-31 DIAGNOSIS — B3731 Acute candidiasis of vulva and vagina: Secondary | ICD-10-CM

## 2015-07-31 DIAGNOSIS — Z202 Contact with and (suspected) exposure to infections with a predominantly sexual mode of transmission: Secondary | ICD-10-CM

## 2015-07-31 DIAGNOSIS — B373 Candidiasis of vulva and vagina: Secondary | ICD-10-CM

## 2015-07-31 DIAGNOSIS — Z113 Encounter for screening for infections with a predominantly sexual mode of transmission: Secondary | ICD-10-CM | POA: Insufficient documentation

## 2015-07-31 LAB — POCT WET PREP (WET MOUNT): CLUE CELLS WET PREP WHIFF POC: NEGATIVE

## 2015-07-31 LAB — HIV ANTIBODY (ROUTINE TESTING W REFLEX): HIV: NONREACTIVE

## 2015-07-31 MED ORDER — FLUCONAZOLE 150 MG PO TABS: 150.0000 mg | ORAL_TABLET | Freq: Once | ORAL | Status: DC

## 2015-07-31 MED ORDER — METRONIDAZOLE 500 MG PO TABS: 2000.0000 mg | ORAL_TABLET | Freq: Once | ORAL | Status: AC

## 2015-07-31 NOTE — Addendum Note (Signed)
Addended by: Lamonte SakaiZIMMERMAN RUMPLE, Fumie Fiallo D on: 07/31/2015 10:54 AM   Modules accepted: Orders

## 2015-07-31 NOTE — Patient Instructions (Addendum)
-   We will call you with the results of your testing.  - You will need to return in 4 - 6 weeks for repeat testing for some infections that would not be positive yet. These were checked today to make sure you were not already positive.  - Take diflucan once today and again in 7 days IF you still have symptoms.   Our clinic's number is [phone]. Feel free to call any time with questions or concerns. We will answer any questions after hours with our 24-hour emergency line at that number as well.   - Dr. Jarvis NewcomerGrunz

## 2015-07-31 NOTE — Progress Notes (Signed)
Subjective: [name] is a 40 y.o. female presenting for vaginal itching.  She reports 8 days of worsening vaginal irritation/itching associated with thick white discharge and stinging with urination. Multiple OTC yeast Tx's tried with only mild improvement, never resolution. Sexually active with single new female 2 weeks ago with condom, though this broke. He is having no symptoms.    - LMP: 07/03/2015 (maybe today) - ROS: No fevers, abdominal pain, urinary frequency or urgency.  - + H/o treated chlamydia at age 40. BTL ~16 yrs ago.   Objective: BP 125/79 mmHg  Pulse 90  Temp(Src) 98.4 F (36.9 C) (Oral)  Ht 5\' 2"  (1.575 m)  Wt 171 lb (77.565 kg)  BMI 31.27 kg/m2  LMP 07/31/2015 (Exact Date) Gen: Well-appearing 40 y.o. female in no distress. Skin: No rash on palms or soles.  Pelvic: External genitalia within normal limits. Erythema surrounding urethral meatus. Vaginal mucosa pink, moist, normal rugae. Nonfriable cervix without lesions. + bleeding from os with white particulate mixed in blood. Bimanual exam revealed normal, nongravid uterus.  No cervical motion tenderness. No adnexal masses bilaterally. No inguinal lymphadenopathy.   April Zimmerman-Rumple, CMA present throughout duration of exam.   Assessment/Plan: [name] is a 40 y.o. female here for yeast infection.  - Diflucan 150mg  po x1 w/1 refill prn symptoms in 7 days.  - Pt requests full testing. GC/Chl/wet prep/HIV/RPR pending. Will call with results of other testing.  - No UPT because she is currently menstruating, has BTL.  - Deferred U/A as dysuria most likely due to irritation noted at urethral meatus.

## 2015-07-31 NOTE — Telephone Encounter (Signed)
Relayed all results except RPR, + trichomonas to pt, Tx flagyl 2g po x1, get partner tested. Told her we would call her only if syphilis (the only test not yet resulted) resulted positive.

## 2015-08-01 ENCOUNTER — Telehealth: Payer: Self-pay | Admitting: Family Medicine

## 2015-08-01 LAB — CERVICOVAGINAL ANCILLARY ONLY
CHLAMYDIA, DNA PROBE: NEGATIVE
NEISSERIA GONORRHEA: NEGATIVE

## 2015-08-01 LAB — RPR

## 2015-08-01 NOTE — Telephone Encounter (Signed)
I called and all questions answered. She took the medication last night and wants to know when symptoms go away. I advised that, though I don't know for sure, I am confident they should be gone within 48 hours and if symptoms continue beyond that we may need further testing. She will make an appointment if she continues to have worries, questions or concerns.

## 2015-08-01 NOTE — Telephone Encounter (Signed)
Patient was contacted by Dr. Jarvis NewcomerGrunz with test results yesterday, but now has additional questions and would like to speak to him again. Please advise

## 2015-08-08 ENCOUNTER — Telehealth: Payer: Self-pay | Admitting: *Deleted

## 2015-08-08 NOTE — Telephone Encounter (Signed)
Patient called and would like speak with Dr. Jarvis NewcomerGrunz regarding her tests results.  States that she has a few more questions. Jazmin Hartsell,CMA

## 2015-08-11 NOTE — Telephone Encounter (Signed)
Pt forgot if HIV test was negative. It was negative. Again urged that partner needs treatment. He has no PCP, [facility]

## 2015-09-01 ENCOUNTER — Other Ambulatory Visit: Payer: Self-pay | Admitting: Family Medicine

## 2015-12-18 ENCOUNTER — Encounter: Payer: Self-pay | Admitting: Family Medicine

## 2015-12-18 ENCOUNTER — Ambulatory Visit (INDEPENDENT_AMBULATORY_CARE_PROVIDER_SITE_OTHER): Payer: Self-pay | Admitting: Family Medicine

## 2015-12-18 VITALS — BP 109/79 | HR 87 | Temp 98.3°F | Wt 170.4 lb

## 2015-12-18 DIAGNOSIS — T63301A Toxic effect of unspecified spider venom, accidental (unintentional), initial encounter: Secondary | ICD-10-CM

## 2015-12-18 NOTE — Patient Instructions (Signed)
Thank you so much for coming to visit today! I suspect your bite is due to a spider, however this cannot be confirmed without seeing the spider bite you. I would continue to monitor it for now. Please return if the area continues to enlarge.  Dr. Caroleen Hamman   Spider Bite Spider bites are not common. When spider bites do happen, most do not cause serious health problems. There are only a few types of spider bites that can cause serious health problems. CAUSES A spider bite usually happens when a person accidentally makes contact with a spider in a way that traps the spider against the person's skin. SYMPTOMS Symptoms may vary depending on the type of spider. Some spider bites may cause symptoms within 1 hour after the bite. For other spider bites, it may take 1-2 days for symptoms to develop. Common symptoms include:  Redness and swelling in the area of the bite.  Discomfort or pain in the area of the bite. A few types of spiders, such as the black widow spider or the brown recluse spider, can inject poison (venom) into a bite wound. This venom causes more serious symptoms. Symptoms of a venomous spider bite vary, and may include:  Muscle cramps.  Nausea, vomiting, or abdominal pain.  Fever.  A skin sore (lesion) that spreads. This can break into an open wound (skin ulcer).  Light-headedness or dizziness. DIAGNOSIS This condition may be diagnosed based on your symptoms and a physical exam. Your health care provider will ask about the history of your injury and any details you may have about the spider. This may help to determine what type of spider it was that bit you. TREATMENT Many spider bites do not require treatment. If needed, treatment may include:  Icing and keeping the bite area raised (elevated).  Over-the-counter or prescription medicines to help control symptoms. HOME CARE INSTRUCTIONS Medicines  Take or apply over-the-counter and prescription medicines only as told by  your health care provider.  If you were prescribed an antibiotic medicine, take or apply it as told by your health care provider. Do not stop using the antibiotic even if your condition improves. General Instructions  Do not scratch the bite area.  Keep the bite area clean and dry. Wash the bite area daily with soap and water as told by your health care provider.  If directed, apply ice to the bite area.  Put ice in a plastic bag.  Place a towel between your skin and the bag.  Leave the ice on for 20 minutes, 2-3 times per day.  Elevate the affected area above the level of your heart while you are sitting or lying down, if possible.  Keep all follow-up visits as told by your health care provider. This is important. SEEK MEDICAL CARE IF:  Your bite does not get better after 3 days of treatment.  Your bite turns black or purple.  You have increased redness, swelling, or pain at the site of the bite. SEEK IMMEDIATE MEDICAL CARE IF:  You develop shortness of breath or chest pain.  You have fluid, blood, or pus coming from the bite area.  You have muscle cramps or painful muscle spasms.  You develop abdominal pain, nausea, or vomiting.  You feel unusually tired (fatigued) or sleepy.   This information is not intended to replace advice given to you by your health care provider. Make sure you discuss any questions you have with your health care provider.   Document Released: 06/20/2004  Document Revised: 02/01/2015 Document Reviewed: 09/28/2014 Elsevier Interactive Patient Education [employer].

## 2015-12-19 NOTE — Progress Notes (Signed)
Subjective:     Patient ID: [name], female   DOB: [birth year], 40 y.o.   MRN: [id number]  HPI [name] is a 40yo female presenting for lesion on back of her leg. - Reports she first noticed lesion on the back of her left calf one week ago. Initially thought it was a mosquito bite. - Did not see what bit her - First noted black center that has been spreading 4-5 days ago - Has several other mosquito bites on her stomach for a few days, but these have not been progressing. She believes these really are mosquito bites. - Denies any systemic symptoms, including fever, joint pain, nausea, vomiting, fatigue - Has not used any medications  Review of Systems Per HPI. Other systems negative.    Objective:   Physical Exam  Constitutional: She appears well-developed and well-nourished. No distress.  Cardiovascular: Normal rate, regular rhythm and intact distal pulses.   Skin:  0.3x0.5cm lesion on back of left calf with erythematous base and necrotic center. Please see picture below.       Assessment and Plan:     1. Spider bite, accidental or unintentional, initial encounter - Did not see what bit her, but suspected given presentation and history - Not acutely infected. No systemic symptoms. Afebrile. - Recommend monitoring for now. To follow up if other symptoms develop or if she notices the area continues to enlarge. Consider referral to Plastic Surgery if fails to improve. - Smoker

## 2015-12-21 ENCOUNTER — Telehealth: Payer: Self-pay | Admitting: *Deleted

## 2015-12-21 DIAGNOSIS — N921 Excessive and frequent menstruation with irregular cycle: Secondary | ICD-10-CM

## 2015-12-21 NOTE — Telephone Encounter (Signed)
Patient states that at last appointment she spoke with MD about some issues with her period that hasnt resolved. She would liket o speak with PCP concerning this.

## 2015-12-22 ENCOUNTER — Ambulatory Visit (INDEPENDENT_AMBULATORY_CARE_PROVIDER_SITE_OTHER): Payer: Self-pay | Admitting: Family Medicine

## 2015-12-22 VITALS — BP 116/87 | HR 81 | Temp 98.5°F | Wt 166.8 lb

## 2015-12-22 DIAGNOSIS — N938 Other specified abnormal uterine and vaginal bleeding: Secondary | ICD-10-CM

## 2015-12-22 MED ORDER — MEDROXYPROGESTERONE ACETATE 2.5 MG PO TABS: 5.0000 mg | ORAL_TABLET | Freq: Every day | ORAL | Status: DC

## 2015-12-22 MED ORDER — MEDROXYPROGESTERONE ACETATE 5 MG PO TABS: 5.0000 mg | ORAL_TABLET | Freq: Every day | ORAL | 0 refills | Status: DC

## 2015-12-22 NOTE — Telephone Encounter (Signed)
Return call to patient regarding medication to help stop menses.  Patient has been bleeding for 15 days now.  Patient reported that it has been several years since she has had the medication.  Advised patient that her PCP is out of the office may return next week.  Nurse will discuss her concerns with another provider; but she might be asked to schedule an appointment.  Precept with Dr. Jennette Kettle, patient need to seen in clinic for further workup.  Advised patient to walk-in today and nurse will try and get her seen since it is the weekend.  Clovis Pu, RN

## 2015-12-22 NOTE — Progress Notes (Signed)
    Subjective:  [name] is a 40 y.o. female who presents to the [facility] today with a chief complaint of menstrual bleeding x15 days (today makes day 15). Bleeding is just as heavy as a normal period. She spotted for 2 days in between (this past Thursday, Friday) but has been continuous otherwise.There are no clots present, just described thin consistency blood.  Her periods normally last 5-6 days, regular flow, and are generally regular with no intermenstrual bleeding.  Patient states that this happened before a few years ago, she saw a physician at clinic, got testing done and everything came back normal. She was given a medication that was able to stop her menstrual bleeding. Her most recent Pap from March shows a normal result. Pelvic US and biopsy done in the past were also normal. She states she had been on the pill over 18 years ago and has had a tubal ligation.   She denies pain dysmenorrhea, dyschezia, dyspareunia. She also denies abdominal pain/pelvic pain, headache, but does have some cramping associated with menses. States she does not want OCPs because her cycle is usually regular,  but she may consider them at a later time.    Objective:   Physical Exam: BP 116/87 (BP Location: Left Arm, Patient Position: Sitting, Cuff Size: Normal)   Pulse 81   Temp 98.5 F (36.9 C) (Oral)   Wt 166 lb 12.8 oz (75.7 kg)   LMP 12/07/2015 (Exact Date)   BMI 30.51 kg/m    Gen: NAD, resting comfortably, alert oriented CV: RRR with no murmurs appreciated, N S1S2 Pulm: NWOB, CTAB with no crackles, wheezes, or rhonchi GI: Normal bowel sounds present. Soft, Nontender, Nondistended. MSK: no edema, cyanosis, or clubbing noted Skin: warm, dry Neuro: grossly normal, moves all extremities Psych: Normal affect and thought content   Assessment/Plan:   40 y/o F presenting with dysfunctional uterine bleeding x15d  Abnormal Uterine Bleeding -Reassured patient that this is likely due to a  hormonal imbalance and may resolve on its own. -prescribed Medroxyprogesterone (Provera) 5mg  x 5 days course to stop her bleeding and resume menstrual cycle per usual -Instructed patient that if it does not stop or worsening symptoms follow up with the office and make another appointment to see PCP -gave information regarding AUB  -Discussed smoking cessation, patient not interested at this time.  Freddrick March, MD  12/22/2015

## 2015-12-22 NOTE — Telephone Encounter (Signed)
Will forward to PCP.  Martin, Tamika L, RN  

## 2015-12-22 NOTE — Patient Instructions (Signed)
Abnormal Uterine Bleeding °Abnormal uterine bleeding means bleeding from the vagina that is not your normal menstrual period. This can be: °· Bleeding or spotting between periods. °· Bleeding after sex (sexual intercourse). °· Bleeding that is heavier or more than normal. °· Periods that last longer than usual. °· Bleeding after menopause. °There are many problems that may cause this. Treatment will depend on the cause of the bleeding. Any kind of bleeding that is not normal should be reviewed by your doctor.  °HOME CARE °Watch your condition for any changes. These actions may lessen any discomfort you are having: °· Do not use tampons or douches as told by your doctor. °· Change your pads often. °You should get regular pelvic exams and Pap tests. Keep all appointments for tests as told by your doctor. °GET HELP IF: °· You are bleeding for more than 1 week. °· You feel dizzy at times. °GET HELP RIGHT AWAY IF:  °· You pass out. °· You have to change pads every 15 to 30 minutes. °· You have belly pain. °· You have a fever. °· You become sweaty or weak. °· You are passing large blood clots from the vagina. °· You feel sick to your stomach (nauseous) and throw up (vomit). °MAKE SURE YOU: °· Understand these instructions. °· Will watch your condition. °· Will get help right away if you are not doing well or get worse. °  °This information is not intended to replace advice given to you by your health care provider. Make sure you discuss any questions you have with your health care provider. °  °Document Released: 03/10/2009 Document Revised: 05/18/2013 Document Reviewed: 12/10/2012 °Elsevier Interactive Patient Education ©2016 Elsevier Inc. ° °

## 2015-12-22 NOTE — Telephone Encounter (Signed)
Pt called again wanting [place] to call in a Rx for a pill to stop the bleeding, she has been on her period for two weeks. Pt states she has been prescribed this before. Please call pt after medication has been called in. Thanks! ep

## 2015-12-22 NOTE — Telephone Encounter (Signed)
Pt called again saying she did not want to go through the weekend without this medication. She is very frustrated and wants to know how much longer. ep

## 2016-03-08 ENCOUNTER — Other Ambulatory Visit: Payer: Self-pay | Admitting: Family Medicine

## 2016-05-02 ENCOUNTER — Encounter: Payer: Self-pay | Admitting: Family Medicine

## 2016-05-02 ENCOUNTER — Ambulatory Visit (INDEPENDENT_AMBULATORY_CARE_PROVIDER_SITE_OTHER): Payer: Self-pay | Admitting: Family Medicine

## 2016-05-02 DIAGNOSIS — L0291 Cutaneous abscess, unspecified: Secondary | ICD-10-CM | POA: Insufficient documentation

## 2016-05-02 DIAGNOSIS — L02219 Cutaneous abscess of trunk, unspecified: Secondary | ICD-10-CM

## 2016-05-02 MED ORDER — CEPHALEXIN 500 MG PO CAPS: 500.0000 mg | ORAL_CAPSULE | Freq: Two times a day (BID) | ORAL | 0 refills | Status: DC

## 2016-05-02 NOTE — Patient Instructions (Signed)
Incision and Drainage, Care After  Refer to this sheet in the next few weeks. These instructions provide you with information about caring for yourself after your procedure. Your health care provider may also give you more specific instructions. Your treatment has been planned according to current medical practices, but problems sometimes occur. Call your health care provider if you have any problems or questions after your procedure.  What can I expect after the procedure?  After the procedure, it is common to have:  · Pain or discomfort around your incision site.  · Drainage from your incision.     Follow these instructions at home:  ·   · Take over-the-counter and prescription medicines only as told by your health care provider.  · If you were prescribed an antibiotic medicine, take it as told by your health care provider. Do not stop taking the antibiotic even if you start to feel better.  · Follow instructions from your health care provider about:  ? How to take care of your incision.  ? When and how you should change your packing and bandage (dressing). Wash your hands with soap and water before you change your dressing. If soap and water are not available, use hand sanitizer.  ? When you should remove your dressing.  · Do not take baths, swim, or use a hot tub until your health care provider approves.  · Keep all follow-up visits as told by your health care provider. This is important.  · Check your incision area every day for signs of infection. Check for:  ? More redness, swelling, or pain.  ? More fluid or blood.  ? Warmth.  ? Pus or a bad smell.  Contact a health care provider if:  · Your cyst or abscess returns.  · You have a fever.  · You have more redness, swelling, or pain around your incision.  · You have more fluid or blood coming from your incision.  · Your incision feels warm to the touch.  · You have pus or a bad smell coming from your incision.  Get help right away if:  · You have severe pain or  bleeding.  · You cannot eat or drink without vomiting.  · You have decreased urine output.  · You become short of breath.  · You have chest pain.  · You cough up blood.  · The area where the incision and drainage occurred becomes numb or it tingles.  This information is not intended to replace advice given to you by your health care provider. Make sure you discuss any questions you have with your health care provider.  Document Released: 08/05/2011 Document Revised: 10/13/2015 Document Reviewed: 03/03/2015  Elsevier Interactive Patient Education © 2017 Elsevier Inc.   

## 2016-05-02 NOTE — Assessment & Plan Note (Signed)
Patient is here signs and symptoms consistent with a abscess of her right hip. Purulent drainage was expressed from this site. A I&D was performed in clinic today and patient tolerated this well. Abscess did not appear to be very deep with minimal to no pockets sequestered. - Keflex 500 mg twice a day - Tylenol for pain discomfort. - Return precautions discussed. - Patient to make a follow-up visit in one week to have incision site checked.

## 2016-05-02 NOTE — Progress Notes (Signed)
   HPI  CC: Boil on right hip 2 weeks. Didn't bother her until 24hrs ago. Has been trying to pop it without success. No fever. Hadn't drained until just now. Trying heat at home. Had not gotten painful until yesterday. She has had similar boils in the past in her axilla and groin.  ROS: No fever, chills, nausea, vomiting, diarrhea, lightheadedness, headache, dizziness.  CC, SH/smoking status, and VS noted  Objective: BP 132/80   Pulse 89   Temp 97.4 F (36.3 C) (Oral)   Ht 5\' 2"  (1.575 m)   Wt 168 lb (76.2 kg)   BMI 30.73 kg/m  Gen: NAD, alert, cooperative, and pleasant. CV: RRR, no murmur Resp: CTAB, no wheezes, non-labored Integument: Erythematous draining abscess on the right hip just distal to the iliac crest. Central dark violet coloration suggesting bleeding. 2 cm radius of surrounding induration. 4 cm radius of surrounding erythema. Ext: No edema, warm Neuro: Alert and oriented, Speech clear, No gross deficits  INCISION AND DRAINAGE: Right hip abscess  A timeout protocol was performed prior to initiating the procedure. The area was prepared and draped in the usual, sterile manner, with 1 Betadine and 3 alcohol swabs. The site was anesthetized with 3ml of 1% lidocaine. A linear stab incision along the local skin lines was made and purulent and bloody substance was expressed. The abcess was probed with scissors and sequestered pockets were opened. Bleeding was minimal. No packing was deemed necessary.   The patient tolerated the procedure well without complications. Nursing dressed the wound. Standard post-procedure care is explained and return precautions are given.   Assessment and plan:  Skin abscess Patient is here signs and symptoms consistent with a abscess of her right hip. Purulent drainage was expressed from this site. A I&D was performed in clinic today and patient tolerated this well. Abscess did not appear to be very deep with minimal to no pockets sequestered. -  Keflex 500 mg twice a day - Tylenol for pain discomfort. - Return precautions discussed. - Patient to make a follow-up visit in one week to have incision site checked.   Meds ordered this encounter  Medications  . cephALEXin (KEFLEX) 500 MG capsule    Sig: Take 1 capsule (500 mg total) by mouth 2 (two) times daily.    Dispense:  20 capsule    Refill:  0     Kathee DeltonIan D Jvon Meroney, MD,MS,  PGY3 05/02/2016 5:31 PM

## 2016-06-03 ENCOUNTER — Telehealth: Payer: Self-pay | Admitting: Family Medicine

## 2016-06-03 ENCOUNTER — Emergency Department (HOSPITAL_COMMUNITY): Payer: Self-pay

## 2016-06-03 ENCOUNTER — Emergency Department (HOSPITAL_COMMUNITY)
Admission: EM | Admit: 2016-06-03 | Discharge: 2016-06-03 | Disposition: A | Discharge status: Home / Self Care | Payer: Self-pay | Attending: Emergency Medicine | Admitting: Emergency Medicine

## 2016-06-03 ENCOUNTER — Encounter (HOSPITAL_COMMUNITY): Payer: Self-pay

## 2016-06-03 DIAGNOSIS — J069 Acute upper respiratory infection, unspecified: Secondary | ICD-10-CM | POA: Insufficient documentation

## 2016-06-03 DIAGNOSIS — F17210 Nicotine dependence, cigarettes, uncomplicated: Secondary | ICD-10-CM | POA: Insufficient documentation

## 2016-06-03 MED ORDER — AMOXICILLIN 500 MG PO CAPS: 1000.0000 mg | ORAL_CAPSULE | Freq: Two times a day (BID) | ORAL | 0 refills | Status: DC

## 2016-06-03 MED ORDER — ALBUTEROL SULFATE (2.5 MG/3ML) 0.083% IN NEBU
5.0000 mg | INHALATION_SOLUTION | Freq: Once | RESPIRATORY_TRACT | Status: AC
  Administered 2016-06-03: 5 mg via RESPIRATORY_TRACT
  Filled 2016-06-03: qty 6

## 2016-06-03 MED ORDER — ALBUTEROL SULFATE HFA 108 (90 BASE) MCG/ACT IN AERS
2.0000 | INHALATION_SPRAY | Freq: Once | RESPIRATORY_TRACT | Status: AC
  Administered 2016-06-03: 2 via RESPIRATORY_TRACT
  Filled 2016-06-03: qty 6.7

## 2016-06-03 MED ORDER — AMOXICILLIN 500 MG PO CAPS
1000.0000 mg | ORAL_CAPSULE | Freq: Once | ORAL | Status: AC
Start: 2016-06-03 — End: 2016-06-03
  Administered 2016-06-03: 1000 mg via ORAL
  Filled 2016-06-03: qty 2

## 2016-06-03 NOTE — Telephone Encounter (Signed)
Family Medicine After hours phone call  Patient calling to see "if I should go to the emergency room or wait until my doctor's office is open". Patient reports gradually worsening cough and SOB. She says "now it's effecting my ears". Denies CP.  Patient informed that if she felt comfortable waiting until 830am when the clinic opened then that may be the best option. If not then the ED is always available. Patient says that she believes the ED is most appropriate b/c "nows it's in my ears". B/c no physical exam is able to be performed I did not feel comfortable discouraging her, however, patient speaking in full, unlabored sentences throughout our discussion. Patient had no other questions.   Kathee DeltonIan D McKeag, MD,MS,  PGY3 06/03/2016 6:26 AM

## 2016-06-03 NOTE — ED Provider Notes (Signed)
[facility]-EMERGENCY DEPT Provider Note   CSN: [id number] Arrival date & time: 06/03/16  0701     History   Chief Complaint Chief Complaint  Patient presents with  . Cough  . Generalized Body Aches    HPI   Blood pressure 121/87, pulse 81, temperature 99 F (37.2 C), temperature source Oral, resp. rate 16, height 5' 2.5" (1.588 m), weight 72.6 kg, SpO2 99 %.  [name] is a 41 y.o. female with no significant past medical history complaining of rhinorrhea, otalgia, hoarse voice, productive cough, pleuritic chest pain and low-grade fevers worsening over the course of one week. She denies chest pain at rest, sick contacts, nausea, vomiting, change in bowel or bladder habits. On review of systems she endorses myalgia with decreased by mouth intake. She's been taking over-the-counter medicines such as DayQuil and NyQuil with little relief.  Past Medical History:  Diagnosis Date  . Vertigo   . Vertigo     Patient Active Problem List   Diagnosis Date Noted  . Skin abscess 05/02/2016  . Preventative health care 08/12/2014  . Obesity 06/08/2012  . BPPV (benign paroxysmal positional vertigo) 12/10/2011  . Family history of breast cancer in first degree relative 08/30/2010  . Family history of colon cancer 08/30/2010  . DUB (dysfunctional uterine bleeding) 08/14/2010  . HYPERLIPIDEMIA 04/10/2010  . TOBACCO DEPENDENCE 07/24/2006    Past Surgical History:  Procedure Laterality Date  . TUBAL LIGATION      OB History    No data available       Home Medications    Prior to Admission medications   Medication Sig Start Date End Date Taking? Authorizing Provider  DM-Doxylamine-Acetaminophen (NYQUIL COLD & FLU PO) Take 1 capsule by mouth at bedtime as needed (cold).   Yes Historical Provider, MD  ibuprofen (ADVIL,MOTRIN) 200 MG tablet Take 200 mg by mouth every 6 (six) hours as needed for moderate pain.   Yes Historical Provider, MD  meclizine (ANTIVERT) 25 MG tablet TAKE 1  TABLET BY MOUTH 3 TIMES A DAY AS NEEDED FOR DIZZINESS 03/08/16  Yes [place] N Rumley, DO  Pseudoephedrine-APAP-DM (DAYQUIL MULTI-SYMPTOM COLD/FLU PO) Take 1 capsule by mouth every 8 (eight) hours as needed (cold).   Yes Historical Provider, MD  amoxicillin (AMOXIL) 500 MG capsule Take 2 capsules (1,000 mg total) by mouth 2 (two) times daily. 06/03/16   Jackqueline Aquilar, PA-C  cephALEXin (KEFLEX) 500 MG capsule Take 1 capsule (500 mg total) by mouth 2 (two) times daily. Patient not taking: Reported on 06/03/2016 05/02/16   Kathee Delton, MD  medroxyPROGESTERone (PROVERA) 5 MG tablet Take 1 tablet (5 mg total) by mouth daily. 12/22/15 12/27/15  Freddrick March, MD    Family History Family History  Problem Relation Age of Onset  . Cancer Mother     mother breast ca at 3 yoa, and colon ca at 28 yoa    Social History Social History  Substance Use Topics  . Smoking status: Current Every Day Smoker    Packs/day: 0.75    Types: Cigarettes  . Smokeless tobacco: Never Used  . Alcohol use No     Allergies   Patient has no known allergies.   Review of Systems Review of Systems  10 systems reviewed and found to be negative, except as noted in the HPI.  Physical Exam Updated Vital Signs BP 123/76   Pulse 101   Temp 99 F (37.2 C) (Oral)   Resp 18   Ht 5' 2.5" (  1.588 m)   Wt 72.6 kg   SpO2 98%   BMI 28.80 kg/m   Physical Exam  Constitutional: She appears well-developed and well-nourished.  HENT:  Head: Normocephalic.  Right Ear: External ear normal.  Left Ear: External ear normal.  Mouth/Throat: Oropharynx is clear and moist. No oropharyngeal exudate.  No drooling or stridor. Posterior pharynx mildly erythematous no significant tonsillar hypertrophy. No exudate. Soft palate rises symmetrically. No TTP or induration under tongue.   No tenderness to palpation of frontal or bilateral maxillary sinuses.  Mild mucosal edema in the nares with scant rhinorrhea.  Left TM erythematous and  bulging, dulled light reflex    Eyes: Conjunctivae and EOM are normal. Pupils are equal, round, and reactive to light.  Neck: Normal range of motion. Neck supple.  Cardiovascular: Normal rate and regular rhythm.   Pulmonary/Chest: Effort normal and breath sounds normal. No stridor. No respiratory distress. She has no wheezes. She has no rales. She exhibits no tenderness.  Abdominal: Soft. There is no tenderness. There is no rebound and no guarding.  Nursing note and vitals reviewed.    ED Treatments / Results  Labs (all labs ordered are listed, but only abnormal results are displayed) Labs Reviewed - No data to display  EKG  EKG Interpretation None       Radiology Dg Chest 2 View  Result Date: 06/03/2016 CLINICAL DATA:  Cough, shortness of breath and fever. EXAM: CHEST  2 VIEW COMPARISON:  None. FINDINGS: The heart size and mediastinal contours are within normal limits. Mild pulmonary interstitial prominence likely reflecting chronic disease. There is no evidence of pulmonary edema, consolidation, pneumothorax, nodule or pleural fluid. The visualized skeletal structures are unremarkable. IMPRESSION: No active cardiopulmonary disease. Electronically Signed   By: Irish LackGlenn  Yamagata M.D.   On: 06/03/2016 07:37    Procedures Procedures (including critical care time)  Medications Ordered in ED Medications  amoxicillin (AMOXIL) capsule 1,000 mg (1,000 mg Oral Given 06/03/16 1131)  albuterol (PROVENTIL HFA;VENTOLIN HFA) 108 (90 Base) MCG/ACT inhaler 2 puff (2 puffs Inhalation Given 06/03/16 1133)  albuterol (PROVENTIL) (2.5 MG/3ML) 0.083% nebulizer solution 5 mg (5 mg Nebulization Given 06/03/16 1132)     Initial Impression / Assessment and Plan / ED Course  I have reviewed the triage vital signs and the nursing notes.  Pertinent labs & imaging results that were available during my care of the patient were reviewed by me and considered in my medical decision making (see chart for  details).  Clinical Course     Vitals:   06/03/16 1121 06/03/16 1130 06/03/16 1200 06/03/16 1245  BP: 112/83 107/77 123/81 123/76  Pulse: 80 84 97 101  Resp: 18     Temp:      TempSrc:      SpO2: 98% 98% 98% 98%  Weight:      Height:        Medications  amoxicillin (AMOXIL) capsule 1,000 mg (1,000 mg Oral Given 06/03/16 1131)  albuterol (PROVENTIL HFA;VENTOLIN HFA) 108 (90 Base) MCG/ACT inhaler 2 puff (2 puffs Inhalation Given 06/03/16 1133)  albuterol (PROVENTIL) (2.5 MG/3ML) 0.083% nebulizer solution 5 mg (5 mg Nebulization Given 06/03/16 1132)    Jeneen Rinkslsie M Russell is 41 y.o. female presenting with cough, Rhinorrea, NAD, Non-toxic appearing, AFVSS, LSCTA. Chest x-rays without infiltrate, patient is given nebulizer treatment, advised her to take ibuprofen for chest wall pain. Right TM is erythematous with dull light reflex, given the length of time of had her illness I think  it is reasonable to treat for bacterial URI/otitis media. Pt advised patient to push fluids, over-the-counter medications for symptom relief. Work note provided.  Evaluation does not show pathology that would require ongoing emergent intervention or inpatient treatment. Pt is hemodynamically stable and mentating appropriately. Discussed findings and plan with patient/guardian, who agrees with care plan. All questions answered. Return precautions discussed and outpatient follow up given.    Final Clinical Impressions(s) / ED Diagnoses   Final diagnoses:  Upper respiratory tract infection, unspecified type    New Prescriptions Discharge Medication List as of 06/03/2016 12:14 PM    START taking these medications   Details  amoxicillin (AMOXIL) 500 MG capsule Take 2 capsules (1,000 mg total) by mouth 2 (two) times daily., Starting Mon 06/03/2016, [employer], PA-C 06/03/16 1258    Shaune Pollack, MD 06/05/16 [phone]

## 2016-06-03 NOTE — ED Triage Notes (Signed)
Per PT, Pt is coming from home with body aches, hoarseness, productive cough x 1 week. Pt reports increased fatigue over the last week with symptoms. Highest temp at home was 100.2. Denies nausea, vomiting, or diarrhea.

## 2016-06-03 NOTE — Discharge Instructions (Signed)
For pain control please take ibuprofen (also known as Motrin or Advil) 800mg (this is normally 4 over the counter pills) 3 times a day  for 5 days. Take with food to minimize stomach irritation. ° ° °Take your antibiotics as directed and to completion. You should never have any leftover antibiotics! Push fluids and stay well hydrated.  ° °Any antibiotic use can reduce the efficacy of hormonal birth control. Please use back up method of contraception.  ° °Please follow with your primary care doctor in the next 2 days for a check-up. They must obtain records for further management.  ° °Do not hesitate to return to the Emergency Department for any new, worsening or concerning symptoms.  ° °

## 2016-09-03 ENCOUNTER — Other Ambulatory Visit: Payer: Self-pay | Admitting: Family Medicine

## 2016-09-16 ENCOUNTER — Ambulatory Visit (INDEPENDENT_AMBULATORY_CARE_PROVIDER_SITE_OTHER): Payer: Self-pay | Admitting: Family Medicine

## 2016-09-16 ENCOUNTER — Encounter: Payer: Self-pay | Admitting: Family Medicine

## 2016-09-16 VITALS — BP 98/68 | HR 59 | Temp 98.3°F | Ht 62.0 in | Wt 169.4 lb

## 2016-09-16 DIAGNOSIS — M7651 Patellar tendinitis, right knee: Secondary | ICD-10-CM

## 2016-09-16 MED ORDER — MENTHOL (TOPICAL ANALGESIC) 10 % EX GEL: CUTANEOUS | 1 refills | Status: DC

## 2016-09-16 NOTE — Patient Instructions (Addendum)
Patellar Tendinitis Patellar tendinitis, also called jumper's knee, is inflammation of the patellar tendon. Tendons are cord-like tissues that connect muscles to bones. The patellar tendon connects the bottom of the kneecap (patella) to the top of the shin bone (tibia). Patellar tendinitis causes pain in the front of the knee. The condition happens in the following stages:  Stage 1. In this stage, there is pain only after activity.  Stage 2: In this stage, you have pain during and after activity.  Stage 3: In this stage, you have pain during and after activity that limits your ability to do the activity.  Stage 4: In this stage, the tendon tears and severely limits your activity. What are the causes? This condition is caused by repeated (repetitive) stress on the tendon. This stress may cause the tendon to stretch, swell, thicken, or tear. What increases the risk? The following factors make you more likely to develop this condition:  Participating in sports that involve running, kicking and jumping, especially on hard surfaces. These include:  Basketball.  Volleyball.  Soccer.  Track and field.  Having tight thigh muscles.  Having received steroid injections in the tendon.  Having had knee surgery.  Being 16-40 years old.  Having rheumatoid arthritis or diabetes.  Training too hard. What are the signs or symptoms? The main symptom of this condition is pain in the front of the knee. The pain usually starts slowly then it gradually gets worse. It may become painful to straighten your leg. How is this diagnosed? This condition may be diagnosed based on:  Your symptoms.  A medical history.  A physical exam. During the physical exam, your health care provider may check for tenderness in your patella, tightness in your thigh muscles, and pain when you straighten your knee.  Imaging tests, including:  X-rays. These will show the position and condition of your  patella.  MRI. This will show any tears in your tendon.  Ultrasound. This will show any swelling in your tendon and the thickness of your tendon. How is this treated? Treatment for this condition depends on the stage of the condition. It may involve:  Avoiding activities that cause pain.  Icing your knee.  Taking an NSAID to reduce pain and swelling.  Doing stretching and strengthening exercises (physical therapy) when pain and swelling improve.  Having sound wave stimulation to promote healing.  Wearing a knee brace. This may be needed if your condition does not improve with treatment.  Using crutches or a walker. This may be needed if your condition does not improve with treatment.  Surgery. This may be done if you have stage 4 tendinitis. Follow these instructions at home: If You Have a Knee Brace:  Wear it as told by your health care provider. Remove it only as told by your health care provider.  Loosen the brace if your toes become numb and tingle, or if they turn cold and blue.  Do not let your brace get wet if it is not waterproof.  Keep the brace clean.  Ask your health care provider when it is safe for you to drive. Managing pain, stiffness, and swelling  Take over-the-counter and prescription medicines only as told by your health care provider.  If directed, apply ice to the injured area.  Put ice in a plastic bag.  Place a towel between your skin and the bag.  Leave the ice on for 20 minutes, 2-3 times a day.  Move your toes often to avoid stiffness   and to lessen swelling.  Raise (elevate) your knee above the level of your heart while you are sitting or lying down. Activity  Return to your normal activities as told by your health care provider. Ask your health care provider what activities are safe for you.  Do exercises as told by your health care provider. General instructions  Do not use the injured limb to support your body weight until your  health care provider says that you can. Use your crutches or walker as told by your health care provider.  Keep all follow-up visits as told by your health care provider. This is important. How is this prevented?  Warm up and stretch before being active.  Cool down and stretch after being active.  Give your body time to rest between periods of activity.  Make sure to use equipment that fits you.  Be safe and responsible while being active to avoid falls.  Do at least 150 minutes of moderate-intensity exercise each week, such as brisk walking or water aerobics.  Maintain physical fitness, including:  Strength.  Flexibility.  Cardiovascular fitness.  Endurance. Contact a health care provider if:  Your symptoms have not improve in 6 weeks.  Your symptoms get worse. This information is not intended to replace advice given to you by your health care provider. Make sure you discuss any questions you have with your health care provider. Document Released: 05/13/2005 Document Revised: 01/16/2016 Document Reviewed: 02/14/2015 Elsevier Interactive Patient Education  2017 Elsevier Inc.  

## 2016-09-18 NOTE — Progress Notes (Signed)
Subjective:     Patient ID: [name], female   DOB: Aug 31, 1975, 42 y.o.   MRN: [id number]  HPI [name] is a 41yo female presenting today for right knee pain. Also of note, reports right lateral elbow pain occasionally but not present at this time. Reports "not normal" feeling in her right knee. Constantly sore and tender, worse when squatting down or when it touches something. Feels a knot on anterior knee where pain is present--she is concerned it may be a cyst or boil. Pain has been present for several weeks and not improving. Denies locking, giving out, popping. Denies numbness, weakness, or tingling in lower extremities. Denies fever. Denies increased redness or warmth of area. Has been using Tylenol without relief.  Review of Systems Per HPI    Objective:   Physical Exam  Constitutional: She appears well-developed and well-nourished. No distress.  Cardiovascular: Normal rate and regular rhythm.   No murmur heard. Pulmonary/Chest: Effort normal. No respiratory distress.  Musculoskeletal:  ROM of knees symmetric. Tenderness over insertion of patellar tendon on tibial tuberosity. No tenderness over quad tendon. ACL, PCL, MCL, and LCL intact and symmetric. Negative McMurrays. [place] with spur of tibial tuberosity noted.  Psychiatric: She has a normal mood and affect. Her behavior is normal.      Assessment and Plan:     1. Jumper's knee of right side Recommend ice and rest if able. Recommend patellar strap. Handout on exercises given. Follow up if no improvement over the next several weeks.

## 2016-09-23 ENCOUNTER — Telehealth: Payer: Self-pay | Admitting: Family Medicine

## 2016-09-23 NOTE — Telephone Encounter (Signed)
Pt states the pharmacy was unable to fill her Rx for Menthnol 10% gel because that strength doesn't exist. Pt uses [facility] on [address] ep

## 2016-09-26 NOTE — Telephone Encounter (Signed)
Pharmacy is calling because they do have the plain Menthol.She to change the prescription to meclizine generic they can fill that. jw

## 2017-03-03 ENCOUNTER — Other Ambulatory Visit: Payer: Self-pay | Admitting: Family Medicine

## 2017-03-03 DIAGNOSIS — Z1231 Encounter for screening mammogram for malignant neoplasm of breast: Secondary | ICD-10-CM

## 2017-04-03 ENCOUNTER — Other Ambulatory Visit: Payer: Self-pay | Admitting: Student in an Organized Health Care Education/Training Program

## 2017-04-03 MED ORDER — MECLIZINE HCL 25 MG PO TABS
ORAL_TABLET | ORAL | 2 refills | Status: DC
Start: 2017-04-03 — End: 2017-11-18

## 2017-05-29 ENCOUNTER — Encounter: Payer: Self-pay | Admitting: Family Medicine

## 2017-05-29 ENCOUNTER — Other Ambulatory Visit: Payer: Self-pay

## 2017-05-29 ENCOUNTER — Ambulatory Visit (INDEPENDENT_AMBULATORY_CARE_PROVIDER_SITE_OTHER): Payer: Self-pay | Admitting: Family Medicine

## 2017-05-29 VITALS — BP 110/80 | HR 79 | Temp 98.2°F | Wt 175.0 lb

## 2017-05-29 DIAGNOSIS — M25562 Pain in left knee: Secondary | ICD-10-CM

## 2017-05-29 NOTE — Progress Notes (Signed)
    Subjective:  [name] is a 42 y.o. female who [facility] the [facility] today with a chief complaint of right knee pain.   HPI:  Patient woke up from sleep with right knee pain today and reports that she feels like she has limited range of motion and feelings of instability as well as some possible swelling behind her knee.  She denies any trauma, locking, popping, swelling of the knee, fevers or chills.  She has previous he diagnosed with jumper's knee back in March 2018 and was recommended to rest, ice and use a patellar strap and work on exercise.  Now she is reporting the same anterior knee pain and reports that she has a tear in her tendon.   PMH: Tobacco use, hyperlipidemia Tobacco use: Current smoker Medication: reviewed and updated ROS: see HPI   Objective:  Physical Exam: BP 110/80   Pulse 79   Temp 98.2 F (36.8 C) (Oral)   Wt 175 lb (79.4 kg)   LMP 05/02/2017   SpO2 99%   BMI 32.01 kg/m   Gen: 42 year old female in NAD, resting comfortably CV: RRR with no murmurs appreciated Pulm: NWOB, CTAB with no crackles, wheezes, or rhonchi GI: Normal bowel sounds present. Soft, Nontender, Nondistended. MSK: Knees with no gross deformities or edema.  Knees with full active and passive range of motion.  Lateral joint line tenderness on the right side.  Mildly positive McMurray's on the right lateral side and positive [place]ssaly on the right lateral side.  Medial and lateral ligaments with good endpoints bilaterally.  Tenderness to palpation just superior to the tibial tuberosity on the right side.  Tibial tuberosity on the left side or insertion of the quadriceps tendon bilaterally.  Skin: warm, dry Neuro: grossly normal, moves all extremities Psych: Normal affect and thought content  No results found for this or any previous visit (from the past 72 hour(s)).   Assessment/Plan:  Jumper's knee, right side and right lateral knee pain Patient has tenderness over insertion of  patellar tendon and tibial tuberosity and history of jumper's knee on the right side.  She has pain with resisted flexion of the right knee and ultrasound showing spur of the tibial tuberosity.  With positive McMurray's in the lateral side, positive Thessaly in the right lateral side and tenderness to palpation on the right lateral joint line there is concern for right lateral meniscal tear versus osteoarthritis.  Recommended the patient ice the knee for 20-30 minutes every 2-3 hours as well as rest and purchase a patellar strap.  Additionally recommended the patient elevate the knee sitting and use ibuprofen as needed.  Recommend that she follow-up in 3-4 weeks if symptoms do not improve.  Kadija Cruzen L. Myrtie SomanWarden, MD [facility] Resident PGY-2 05/29/2017 3:38 PM

## 2017-05-29 NOTE — Patient Instructions (Addendum)
[name], you were seen today for anterior knee pain that is the same as when you are evaluated by Dr. Caroleen Hammanumley back in April this is a "jumper's knee".  The best treatment for this is rest, icing the knee for 20-30 minutes every 3-4 hours and a patellar strap.   On exam you also had some tenderness on the right lateral part of your knee as well as some feelings of swelling in the back of your knee.  I did an ultrasound and did not see any fluid in the back of your knee and your joint lines looked okay as well.  Given the tenderness I will get an x-ray of your right knee to make sure that you do not have any arthritis.  I am recommending that you work on quad strengthening exercises and icing the knee and you can also use ibuprofen as needed.  I provided you with a handout of exercises.  Please follow-up in a few weeks if you do not have any improvement in your symptoms.  Erin Littler L. Myrtie SomanWarden, MD [facility] Resident PGY-2 05/29/2017 2:09 PM

## 2017-06-16 ENCOUNTER — Other Ambulatory Visit: Payer: Self-pay | Admitting: Family Medicine

## 2017-06-19 ENCOUNTER — Telehealth: Payer: Self-pay | Admitting: *Deleted

## 2017-06-19 NOTE — Telephone Encounter (Signed)
Unfortunately I am not able to prescribe that medication without evaluating the patient. Please ask her to make an appointment to be evaluated.

## 2017-06-19 NOTE — Telephone Encounter (Signed)
Patient called in requesting refill on Provera 5 mg for 4-5 days. States had 18 day period in 2012 with full work-up that said she was premenopausal and could last 10 years. Saw Dr. Nelson ChimesAmin 12/22/2015 for same issue and prescribed Provera 5 mg for 5 days. Now with 11 day period. Does not want to come to office to be seen for this as she already knows provera will work. Patient may be reached at [phone]. Erin FeilL. [name], RN, BSN

## 2017-06-20 NOTE — Telephone Encounter (Signed)
Pt informed of the below message, she is upset and states that she will go to the ED or urgent care.  Fleeger, Maryjo RochesterJessica Dawn, CMA

## 2017-10-22 ENCOUNTER — Encounter: Payer: Self-pay | Admitting: Family Medicine

## 2017-10-22 ENCOUNTER — Ambulatory Visit (INDEPENDENT_AMBULATORY_CARE_PROVIDER_SITE_OTHER): Payer: Self-pay | Admitting: Family Medicine

## 2017-10-22 ENCOUNTER — Other Ambulatory Visit: Payer: Self-pay

## 2017-10-22 VITALS — BP 112/80 | HR 79 | Temp 99.0°F | Ht 62.0 in | Wt 180.2 lb

## 2017-10-22 DIAGNOSIS — R319 Hematuria, unspecified: Secondary | ICD-10-CM

## 2017-10-22 DIAGNOSIS — N390 Urinary tract infection, site not specified: Secondary | ICD-10-CM | POA: Insufficient documentation

## 2017-10-22 DIAGNOSIS — R300 Dysuria: Secondary | ICD-10-CM

## 2017-10-22 LAB — POCT UA - MICROSCOPIC ONLY

## 2017-10-22 MED ORDER — CEPHALEXIN 500 MG PO CAPS: 500.0000 mg | ORAL_CAPSULE | Freq: Two times a day (BID) | ORAL | 0 refills | Status: AC

## 2017-10-22 NOTE — Patient Instructions (Signed)
I sent in an antibiotic for your bladder infection.  If symptoms do not clear up, come back and we will do more testing.

## 2017-10-23 ENCOUNTER — Encounter: Payer: Self-pay | Admitting: Family Medicine

## 2017-10-23 NOTE — Assessment & Plan Note (Signed)
Dubious UA but classic symptoms.  Rx.  No culture for cost considerations.

## 2017-10-23 NOTE — Progress Notes (Signed)
   Subjective:    Patient ID: [name], female    DOB: 23-Aug-1975, 42 y.o.   MRN: [id number]  HPI Terminal dysuria for 4 days.  Using OTC Azo dye.  Has had previous UTIs.  No fever, chills, nausea or flank tenderness.  No vag discharge.  monagamous.    Review of Systems     Objective:   Physical Exam  Minor suprapubic tenderness.        Assessment & Plan:

## 2017-11-04 ENCOUNTER — Other Ambulatory Visit: Payer: Self-pay | Admitting: Family Medicine

## 2017-11-04 DIAGNOSIS — Z1231 Encounter for screening mammogram for malignant neoplasm of breast: Secondary | ICD-10-CM

## 2017-11-18 ENCOUNTER — Other Ambulatory Visit: Payer: Self-pay | Admitting: Student in an Organized Health Care Education/Training Program

## 2018-04-03 ENCOUNTER — Encounter: Payer: Self-pay | Admitting: Student in an Organized Health Care Education/Training Program

## 2018-04-03 ENCOUNTER — Other Ambulatory Visit: Payer: Self-pay

## 2018-04-03 ENCOUNTER — Ambulatory Visit (INDEPENDENT_AMBULATORY_CARE_PROVIDER_SITE_OTHER): Payer: Self-pay | Admitting: Student in an Organized Health Care Education/Training Program

## 2018-04-03 VITALS — BP 118/70 | HR 91 | Temp 98.3°F | Ht 62.0 in | Wt 191.0 lb

## 2018-04-03 DIAGNOSIS — N390 Urinary tract infection, site not specified: Secondary | ICD-10-CM

## 2018-04-03 DIAGNOSIS — R300 Dysuria: Secondary | ICD-10-CM

## 2018-04-03 LAB — POCT URINALYSIS DIP (MANUAL ENTRY)
Bilirubin, UA: NEGATIVE
Glucose, UA: NEGATIVE mg/dL
Ketones, POC UA: NEGATIVE mg/dL
Nitrite, UA: POSITIVE — AB
PH UA: 7 (ref 5.0–8.0)
Protein Ur, POC: NEGATIVE mg/dL
Spec Grav, UA: 1.015 (ref 1.010–1.025)
Urobilinogen, UA: 0.2 E.U./dL

## 2018-04-03 LAB — POCT UA - MICROSCOPIC ONLY

## 2018-04-03 MED ORDER — CEPHALEXIN 500 MG PO CAPS: 500.0000 mg | ORAL_CAPSULE | Freq: Two times a day (BID) | ORAL | 0 refills | Status: DC

## 2018-04-03 NOTE — Patient Instructions (Signed)
It was a pleasure seeing you today in our clinic.  Here is the treatment plan we have discussed and agreed upon together:  Please complete your antibiotic course. If you need a different antibiotic, I will give you a call next week.  Our clinic's number is [phone]. Please call with questions or concerns about what we discussed today.  Be well, Dr. Mosetta Putt

## 2018-04-03 NOTE — Progress Notes (Signed)
   CC: Dysuria  HPI: [name] is a 42 y.o. female  Dysuria -  Patient endorses 2-3 days of pain with urination. No frequency or urgency. No fever, no vaginal discharge, no pain in back. She has taken AZO. No recent abx. Denies STI exposure or possible pregnancy. No N/V/D. No constipation.  Review of Symptoms - see HPI PMH - Smoking status noted.     Review of Symptoms:  See HPI for ROS.   CC, SH/smoking status, and VS noted.  Objective: BP 118/70   Pulse 91   Temp 98.3 F (36.8 C) (Oral)   Ht 5\' 2"  (1.575 m)   Wt 191 lb (86.6 kg)   LMP 03/31/2018   SpO2 95%   BMI 34.93 kg/m  GEN: NAD, alert, cooperative, and pleasant. RESPIRATORY: Comfortable work of breathing, speaks in full sentences CV: Regular rate noted, distal extremities well perfused and warm without edema GI: Soft, nondistended GU: No CVA tenderness SKIN: warm and dry, no rashes or lesions NEURO: II-XII grossly intact MSK: Moves 4 extremities equally PSYCH: AAOx3, appropriate affect  Assessment and plan:  1. Dysuria - UA consistent with UTI. Sent for culture. Will call patient if culture results indicate resistance to keflex. - cephALEXin (KEFLEX) 500 MG capsule; Take 1 capsule (500 mg total) by mouth 2 (two) times daily.  Dispense: 14 capsule; Refill: 0  Orders Placed This Encounter  Procedures  . Urine Culture  . POCT urinalysis dipstick    No orders of the defined types were placed in this encounter.    Howard Pouch, MD,MS,  PGY3 04/03/2018 11:01 AM

## 2018-04-05 ENCOUNTER — Encounter: Payer: Self-pay | Admitting: Student in an Organized Health Care Education/Training Program

## 2018-04-05 LAB — URINE CULTURE

## 2018-04-13 ENCOUNTER — Telehealth: Payer: Self-pay | Admitting: *Deleted

## 2018-04-13 NOTE — Telephone Encounter (Signed)
Patient called a second time.  Call back is [phone]  Ples SpecterAlisa Clee Pandit, RN [facility] RN)

## 2018-04-13 NOTE — Telephone Encounter (Signed)
Pt calls because she was given an abx 2 weeks ago.  She has been having vaginal discharge and itching since then.  She has tried OTC monistat which has decreased the discharge but she is still having some itching.  She is requesting a script of diflucan sent without being seen if possible.  Fleeger, Maryjo RochesterJessica Dawn, CMA

## 2018-04-15 ENCOUNTER — Other Ambulatory Visit: Payer: Self-pay | Admitting: Student in an Organized Health Care Education/Training Program

## 2018-04-15 DIAGNOSIS — N898 Other specified noninflammatory disorders of vagina: Secondary | ICD-10-CM

## 2018-04-15 MED ORDER — FLUCONAZOLE 150 MG PO TABS: 150.0000 mg | ORAL_TABLET | Freq: Once | ORAL | 0 refills | Status: AC

## 2018-04-15 NOTE — Telephone Encounter (Signed)
Sent a script for diflucan to [address]. Patient is to take one pill. If symptoms persist in 2 days she may take the other pill. If symptoms persist after that she will have to come in for an appointment so we can evaluate in person.

## 2018-04-15 NOTE — Telephone Encounter (Signed)
Pt informed. Khyri Hinzman Dawn, CMA  

## 2018-05-06 ENCOUNTER — Other Ambulatory Visit (HOSPITAL_COMMUNITY): Payer: Self-pay | Admitting: *Deleted

## 2018-05-06 DIAGNOSIS — Z1231 Encounter for screening mammogram for malignant neoplasm of breast: Secondary | ICD-10-CM

## 2018-05-14 ENCOUNTER — Other Ambulatory Visit: Payer: Self-pay | Admitting: Family Medicine

## 2018-06-19 ENCOUNTER — Ambulatory Visit (INDEPENDENT_AMBULATORY_CARE_PROVIDER_SITE_OTHER): Payer: Self-pay | Admitting: Family Medicine

## 2018-06-19 ENCOUNTER — Other Ambulatory Visit: Payer: Self-pay

## 2018-06-19 VITALS — BP 102/52 | HR 96 | Temp 98.4°F | Wt 195.3 lb

## 2018-06-19 DIAGNOSIS — R319 Hematuria, unspecified: Secondary | ICD-10-CM

## 2018-06-19 DIAGNOSIS — R7309 Other abnormal glucose: Secondary | ICD-10-CM

## 2018-06-19 DIAGNOSIS — N390 Urinary tract infection, site not specified: Secondary | ICD-10-CM

## 2018-06-19 DIAGNOSIS — R399 Unspecified symptoms and signs involving the genitourinary system: Secondary | ICD-10-CM | POA: Insufficient documentation

## 2018-06-19 LAB — POCT GLYCOSYLATED HEMOGLOBIN (HGB A1C): Hemoglobin A1C: 5.3 % (ref 4.0–5.6)

## 2018-06-19 MED ORDER — CEPHALEXIN 500 MG PO CAPS: 500.0000 mg | ORAL_CAPSULE | Freq: Two times a day (BID) | ORAL | 0 refills | Status: AC

## 2018-06-19 MED ORDER — FLUCONAZOLE 150 MG PO TABS: 150.0000 mg | ORAL_TABLET | Freq: Once | ORAL | 0 refills | Status: AC

## 2018-06-19 NOTE — Progress Notes (Signed)
   Subjective:    Patient ID: [name], female    DOB: January 21, 1976, 43 y.o.   MRN: [id number]   CC: UTI symptoms  HPI: Patient is a 43 yo female who presents today complaining of UTI symptoms since Sunday. Patient reports symptoms feels very much like her last UTI in November. Patient report increase frequency but denies dysuria and frequency. She has been taking AZO and probiotic for the past few days and has noticed her urine turned orange. She continue to feel pressure after urination but denies any urgency or hesitancy.  Smoking status reviewed   ROS: all other systems were reviewed and are negative other than in the HPI   Past Medical History:  Diagnosis Date  . Vertigo   . Vertigo     Past Surgical History:  Procedure Laterality Date  . TUBAL LIGATION      Past medical history, surgical, family, and social history reviewed and updated in the EMR as appropriate.  Objective:  BP (!) 102/52   Pulse 96   Temp 98.4 F (36.9 C) (Oral)   Wt 195 lb 5 oz (88.6 kg)   LMP 05/19/2018   SpO2 96%   BMI 35.72 kg/m   Vitals and nursing note reviewed  General: NAD, pleasant, able to participate in exam Cardiac: RRR, normal heart sounds, no murmurs. 2+ radial and PT pulses bilaterally Respiratory: CTAB, normal effort, No wheezes, rales or rhonchi Abdomen: soft, nontender, nondistended, no hepatic or splenomegaly, +BS Extremities: no edema or cyanosis. WWP. Skin: warm and dry, no rashes noted Neuro: alert and oriented x4, no focal deficits Psych: Normal affect and mood   Assessment & Plan:   UTI symptoms Unable to run UA due to collect urine and false positive read by the machine.  Given symptoms described and recent history will treat patient.  Prescribed Keflex 5 mg twice daily for 5 days.  Patient described having yeast infection after last regimen.  We will also prescribe cortisol 150 mg to use as needed.  Given current UTIs, and patient's history of gestational  diabetes A1c was checked it was 5.3.  Recommended frequent bladder emptying (postcoital) and increased fluid intake.   Lovena Neighbours, MD [facility] PGY-3

## 2018-06-19 NOTE — Patient Instructions (Signed)
Acute Urinary Retention, Female    Acute urinary retention means that you cannot pee (urinate) at all, or that you pee too little and your bladder is not emptied completely. If it is not treated, it can lead to kidney damage or other serious problems.  Follow these instructions at home:   Take over-the-counter and prescription medicines only as told by your doctor. Ask your doctor what medicines you should stay away from. Do not take any medicine unless your doctor says it is okay to do so.   If you were sent home with a tube that drains pee from the bladder (catheter), take care of it as told by your doctor.   Drink enough fluid to keep your pee clear or pale yellow.   If you were given an antibiotic, take it as told by your doctor. Do not stop taking the antibiotic even if you start to feel better.   Do not use any products that contain nicotine or tobacco, such as cigarettes and e-cigarettes. If you need help quitting, ask your doctor.   Watch for changes in your symptoms. Tell your doctor about them.   If told, keep track of any changes in your blood pressure at home. Tell your doctor about them.   Keep all follow-up visits as told by your doctor. This is important.  Contact a doctor if:   You have spasms or you leak pee when you have spasms.  Get help right away if:   You have chills or a fever.   You have blood in your pee.   You have a tube that drains the bladder and:  ? The tube stops draining pee.  ? The tube falls out.  Summary   Acute urinary retention means that you cannot pee at all, or that you pee too little and your bladder is not emptied completely. If it is not treated, it can result in kidney damage or other serious problems.   If you were sent home with a tube that drains pee from the bladder, take care of it as told by your doctor.   Pay attention to any changes in your symptoms. Tell your doctor about them.  This information is not intended to replace advice given to you by your  health care provider. Make sure you discuss any questions you have with your health care provider.  Document Released: 10/30/2007 Document Revised: 06/14/2016 Document Reviewed: 06/14/2016  Elsevier Interactive Patient Education  2019 Elsevier Inc.

## 2018-06-19 NOTE — Assessment & Plan Note (Signed)
Unable to run UA due to collect urine and false positive read by the machine.  Given symptoms described and recent history will treat patient.  Prescribed Keflex 5 mg twice daily for 5 days.  Patient described having yeast infection after last regimen.  We will also prescribe cortisol 150 mg to use as needed.  Given current UTIs, and patient's history of gestational diabetes A1c was checked it was 5.3.  Recommended frequent bladder emptying (postcoital) and increased fluid intake.

## 2018-06-21 LAB — URINE CULTURE

## 2018-07-28 ENCOUNTER — Ambulatory Visit (HOSPITAL_COMMUNITY)
Admission: RE | Admit: 2018-07-28 | Discharge: 2018-07-28 | Disposition: A | Discharge status: Home / Self Care | Payer: Self-pay | Source: Ambulatory Visit | Attending: Obstetrics and Gynecology | Admitting: Obstetrics and Gynecology

## 2018-07-28 ENCOUNTER — Ambulatory Visit
Admission: RE | Admit: 2018-07-28 | Discharge: 2018-07-28 | Disposition: A | Discharge status: Home / Self Care | Payer: No Typology Code available for payment source | Source: Ambulatory Visit | Attending: Obstetrics and Gynecology | Admitting: Obstetrics and Gynecology

## 2018-07-28 ENCOUNTER — Encounter (HOSPITAL_COMMUNITY): Payer: Self-pay

## 2018-07-28 VITALS — BP 124/94 | Wt 196.0 lb

## 2018-07-28 DIAGNOSIS — Z1239 Encounter for other screening for malignant neoplasm of breast: Secondary | ICD-10-CM

## 2018-07-28 DIAGNOSIS — Z1231 Encounter for screening mammogram for malignant neoplasm of breast: Secondary | ICD-10-CM

## 2018-07-28 NOTE — Patient Instructions (Signed)
Explained breast self awareness with [name]. Patient did not need a Pap smear today due to last Pap smear and HPV typing was 08/12/2014. Let her know BCCCP will cover Pap smears and HPV typing every 5 years unless has a history of abnormal Pap smears. Referred patient to the [facility] for a screening mammogram. Appointment scheduled for Tuesday, July 28, 2018 at 1610. Patient aware of appointment and will be there. Let patient know the Breast Center will follow up with her within the next couple weeks with results of mammogram by letter or phone. Discussed smoking cessation with patient. Referred to the [facility] Quitline and gave resources to the free smoking cessation classes at [facility].[name] verbalized understanding.  Charne Mcbrien, Kathaleen Maser, RN 3:05 PM

## 2018-07-28 NOTE — Progress Notes (Signed)
No complaints today.   Pap Smear: Pap smear not completed today. Last Pap smear was 08/12/2014 at [facility] and normal with negative HPV. Per patient has no history of an abnormal Pap smear. Last four Pap smear results are in Epic.  Physical exam: Breasts Breasts symmetrical. No skin abnormalities bilateral breasts. No nipple retraction bilateral breasts. No nipple discharge bilateral breasts. No lymphadenopathy. No lumps palpated bilateral breasts. No complaints of pain or tenderness on exam. Referred patient to the [facility] for a screening mammogram. Appointment scheduled for Tuesday, July 28, 2018 at 1610.        Pelvic/Bimanual No Pap smear completed today since last Pap smear and HPV typing was 08/12/2014. Pap smear not indicated per BCCCP guidelines.   Smoking History: Patient is a current smoker. Discussed smoking cessation with patient. Referred to the [facility] Quitline and gave resources to the free smoking cessation classes at [facility].  Patient Navigation: Patient education provided. Access to services provided for patient through BCCCP program.   Breast and Cervical Cancer Risk Assessment: Patient has a family history of her mother having breast cancer. Patient has no known genetic mutations or history of radiation treatment to the chest before age 43. Patient has no history of cervical dysplasia, immunocompromised, or DES exposure in-utero.  Risk Assessment    Risk Scores      07/28/2018   Last edited by: Lynnell Dike, LPN   5-year risk: 1.2 %   Lifetime risk: 18.2 %

## 2018-08-14 ENCOUNTER — Encounter: Payer: Self-pay | Admitting: Family Medicine

## 2018-08-14 ENCOUNTER — Other Ambulatory Visit: Payer: Self-pay

## 2018-08-14 ENCOUNTER — Telehealth: Payer: Self-pay | Admitting: Family Medicine

## 2018-08-14 ENCOUNTER — Ambulatory Visit (INDEPENDENT_AMBULATORY_CARE_PROVIDER_SITE_OTHER): Payer: Self-pay | Admitting: Family Medicine

## 2018-08-14 VITALS — BP 112/74 | HR 113 | Temp 98.7°F | Wt 189.2 lb

## 2018-08-14 DIAGNOSIS — Z23 Encounter for immunization: Secondary | ICD-10-CM | POA: Insufficient documentation

## 2018-08-14 DIAGNOSIS — S91012A Laceration without foreign body, left ankle, initial encounter: Secondary | ICD-10-CM | POA: Insufficient documentation

## 2018-08-14 NOTE — Assessment & Plan Note (Signed)
Patient presents with left leg laceration from weed Wacker incident.  Wound is superficial in nature and will not require any stitching.  Cleaned in the clinic and dressed with gauze and emollient.  Patient was given strict instruction to continue wrapping ankle and using emollient for the next few days.  Will return as needed if wound appears infected, more painful or erythematous. --Recommended NSAIDs for pain control --Gauze with Vaseline for wound care --Tetanus shot given

## 2018-08-14 NOTE — Telephone Encounter (Signed)
Patient called she injured her ankle with a weed eater.  There is no muscle or bone visible but this skin is taken off and it appears white.  She states that "it looks bad" but she is in not too much pain and would like to avoid going to the ER if possible.  Scheduled for appointment this afternoon.  Will route to provider.

## 2018-08-14 NOTE — Progress Notes (Signed)
   Subjective:    Patient ID: [name], female    DOB: 08-05-1975, 43 y.o.   MRN: [id number]   CC: Left ankle injury  HPI: Patient is a 43 year old female who presents today after sustaining a left ankle injury from a weed wacker strength.  Patient reports that she was clipped by 1 of the string from the weed wacker which hit her ankle.  Patient reports burning and tingling sensation with minimal bleeding.  Injury was sustained 2 hours ago.  She has not taken anything for the pain.  Patient is here to make sure that she did not require any stitching.  Smoking status reviewed   ROS: all other systems were reviewed and are negative other than in the HPI   Past Medical History:  Diagnosis Date  . Vertigo   . Vertigo     Past Surgical History:  Procedure Laterality Date  . TUBAL LIGATION      Past medical history, surgical, family, and social history reviewed and updated in the EMR as appropriate.  Objective:  BP 112/74   Pulse (!) 113   Temp 98.7 F (37.1 C) (Oral)   Wt 189 lb 4 oz (85.8 kg)   LMP 07/27/2018   SpO2 97%   BMI 34.61 kg/m   Vitals and nursing note reviewed  General: NAD, pleasant, able to participate in exam Cardiac: RRR, normal heart sounds, no murmurs. 2+ radial and PT pulses bilaterally Respiratory: CTAB, normal effort, No wheezes, rales or rhonchi Abdomen: soft, nontender, nondistended, no hepatic or splenomegaly, +BS Extremities: Laceration seen on the medial aspect of left ankle around malleolus.  Laceration is 7 cm in length, minimal bleeding, injury to the epidermis layer. Skin: warm and dry, no rashes noted Neuro: alert and oriented x4, no focal deficits Psych: Normal affect and mood   Assessment & Plan:   Laceration of left ankle Patient presents with left leg laceration from weed Wacker incident.  Wound is superficial in nature and will not require any stitching.  Cleaned in the clinic and dressed with gauze and emollient.  Patient was  given strict instruction to continue wrapping ankle and using emollient for the next few days.  Will return as needed if wound appears infected, more painful or erythematous. --Recommended NSAIDs for pain control --Gauze with Vaseline for wound care --Tetanus shot given    Lovena Neighbours, MD [facility] PGY-3

## 2018-09-01 ENCOUNTER — Encounter (HOSPITAL_COMMUNITY): Payer: Self-pay | Admitting: *Deleted

## 2018-11-06 ENCOUNTER — Other Ambulatory Visit: Payer: Self-pay | Admitting: Family Medicine

## 2019-01-07 ENCOUNTER — Other Ambulatory Visit: Payer: Self-pay

## 2019-01-07 ENCOUNTER — Telehealth (INDEPENDENT_AMBULATORY_CARE_PROVIDER_SITE_OTHER): Payer: Self-pay | Admitting: Family Medicine

## 2019-01-07 DIAGNOSIS — N3000 Acute cystitis without hematuria: Secondary | ICD-10-CM

## 2019-01-07 MED ORDER — CEPHALEXIN 250 MG PO CAPS: 250.0000 mg | ORAL_CAPSULE | Freq: Four times a day (QID) | ORAL | 0 refills | Status: AC

## 2019-01-07 NOTE — Progress Notes (Signed)
[place] Telemedicine Visit  Patient consented to have virtual visit. Method of visit: Telephone  Encounter participants: Patient: [name] - located at home Provider: Bonnita Hollow - located at office Others (if applicable): n/a  Chief Complaint: UTI  HPI: [name] is a 43 y.o. female who presents with concern of UTI. Has had 2 days of "abdominal pressure" and inability to completely empty bladder. Has felt like this in the past for UTI. Denies  f/c, n/v, hematuria, diarhea, constipation. Patient reports cloudy urine. Denies urinary frequency. Patient reports drinking excessive tea and water. Patient tried phenazopyridine with some improvement.   ROS: per HPI  Pertinent PMHx: H/o of UTI  Exam:  Respiratory: Able to speak in full sentances   Assessment/Plan: Acute cystitis Symptoms are consistent with possible acute cystitis.  Return precautions discussed.  Will treat with course of Keflex.  Patient return if not improved.  Time spent during visit with patient: 5 minutes

## 2019-03-31 ENCOUNTER — Other Ambulatory Visit: Payer: No Typology Code available for payment source

## 2019-03-31 ENCOUNTER — Telehealth (INDEPENDENT_AMBULATORY_CARE_PROVIDER_SITE_OTHER): Payer: Self-pay | Admitting: Family Medicine

## 2019-03-31 ENCOUNTER — Other Ambulatory Visit: Payer: Self-pay | Admitting: Family Medicine

## 2019-03-31 ENCOUNTER — Other Ambulatory Visit: Payer: Self-pay

## 2019-03-31 DIAGNOSIS — R399 Unspecified symptoms and signs involving the genitourinary system: Secondary | ICD-10-CM

## 2019-03-31 DIAGNOSIS — N390 Urinary tract infection, site not specified: Secondary | ICD-10-CM

## 2019-03-31 NOTE — Progress Notes (Signed)
[place] Telemedicine Visit  Patient consented to have virtual visit. Method of visit: Video  Encounter participants: Patient: [name] - located at work Provider: Carollee Leitz - located at home Others (if applicable): none  Chief Complaint: Urinary infection  HPI:  UTI symptoms Patient reports having sensation of pressure mid abdomen that started on Monday.  Similar to episodes of UTI that she has had in the past.  Most recent UTI 12/2018 and treated with Keflex.  She reports having no dysuria or urgency.  She does endorse having frequency and pressure.  She reports using AZO tablets Monday through today with some relief.  Denies any fever.  She denies any dyspareunia.  She reports recent sexual intercourse without voiding after and feels that this is why she has developed UTI symptoms.  She reports that her urine is cloudy but not foul-smelling.  ROS: per HPI  Pertinent PMHx: Recurrent UTI  Exam:  Respiratory:   Assessment/Plan:   Urinary tract infection Patient has history of recurrent UTIs.  Most recently treated with Keflex in August 2020. Advised patient to come to clinic for urinalysis and urine culture.  Will follow-up with results. Encourage patient to void after each sexual encounter.  Drink plenty of fluids  Carollee Leitz MD Time spent during visit with patient: 20 minutes

## 2019-03-31 NOTE — Progress Notes (Signed)
[place] Telemedicine Visit  Patient consented to have virtual visit. Method of visit: Video  Encounter participants: Patient: ARRYN Page - located at work Provider: Carollee Leitz - located at home Others (if applicable): none  Chief Complaint: urinary infection  HPI:  UTI symptoms Patient reports having sensation of pressure in the mid abdomen that started on Monday.  Similar to episodes of UTI that she had in the past.  Most recent UTI 12/2018 and treated with Keflex.  She reports having no dysuria or urgency.  Does endorse having frequency and pressure.  She reports using AZO tablets Monday through today with some relief.  Denies any fever.  She reports no dyspareunia.  She reports recent sexual intercourse without voiding after and feels that this is why she developed UTI symptoms.  She reports that her urine is cloudy but not foul-smelling.   ROS: per HPI  Pertinent PMHx: Recurrent UTIs  Exam:  Respiratory: In no acute distress, no increased work of breathing, able to speak in full sentences.  Assessment/Plan:  Urinary tract infection Patient has history of recurrent UTIs.  Most recently treated with Keflex in August 2020. Advised patient to come to clinic for urinalysis and urine culture.  Will follow-up with results. Encourage patient to void after each sexual encounter.  Drink plenty of fluids.     Time spent during visit with patient: 20 minutes  Carollee Leitz MD

## 2019-03-31 NOTE — Assessment & Plan Note (Signed)
Patient has history of recurrent UTIs.  Most recently treated with Keflex in August 2020. Advised patient to come to clinic for urinalysis and urine culture.  Will follow-up with results. Encourage patient to void after each sexual encounter.  Drink plenty of fluids.

## 2019-03-31 NOTE — Progress Notes (Deleted)
[place] Telemedicine Visit  Patient consented to have virtual visit. Method of visit: {TELEPHONE VS UQJFH:54562}  Encounter participants: Patient: [name] - located at *** Provider: Carollee Leitz - located at *** Others (if applicable): ***  Chief Complaint:  HPI:  ***  ROS: per HPI  Pertinent PMHx: ***  Exam:  Respiratory: ***  Assessment/Plan:  No problem-specific Assessment & Plan notes found for this encounter.    Time spent during visit with patient: *** minutes

## 2019-04-01 ENCOUNTER — Telehealth: Payer: Self-pay | Admitting: *Deleted

## 2019-04-01 NOTE — Telephone Encounter (Signed)
Pt is requesting the results of her U/A. Christen Bame, CMA

## 2019-04-01 NOTE — Telephone Encounter (Signed)
We only saw a urine culture ordered on this patient, no UA was performed. Urine culture takes 48 hrs. Even if a UA had been ordered, patient was taking an AZO, so we would not have been able to run the UA. [name] Bellamie Turney

## 2019-04-01 NOTE — Telephone Encounter (Signed)
Pt informed that U/A not ran because of the AZO.  Advised that urine culture would be back in 48 hours.  Pt is requesting keflex until that time because she is so uncomfortable. She is ok with switching Abx if needed once results come back. Christen Bame, CMA

## 2019-04-02 ENCOUNTER — Other Ambulatory Visit: Payer: Self-pay | Admitting: Family Medicine

## 2019-04-02 LAB — URINE CULTURE

## 2019-04-02 MED ORDER — NITROFURANTOIN MONOHYD MACRO 100 MG PO CAPS: 100.0000 mg | ORAL_CAPSULE | Freq: Two times a day (BID) | ORAL | 0 refills | Status: AC

## 2019-04-02 NOTE — Progress Notes (Signed)
Pt prelim culture positive for 100000 E.Coli Script sent for Macrobid 100mg  BID x 5days Pt aware of treatment  Carollee Leitz MD

## 2019-04-27 IMAGING — MG DIGITAL SCREENING BILATERAL MAMMOGRAM WITH TOMO AND CAD
8 series · 8 of 24 positions shown · non-contrast
Comparison: Previous exam(s).

CLINICAL DATA: Screening.

EXAM:
DIGITAL SCREENING BILATERAL MAMMOGRAM WITH TOMO AND CAD

[R CC synth-2D]
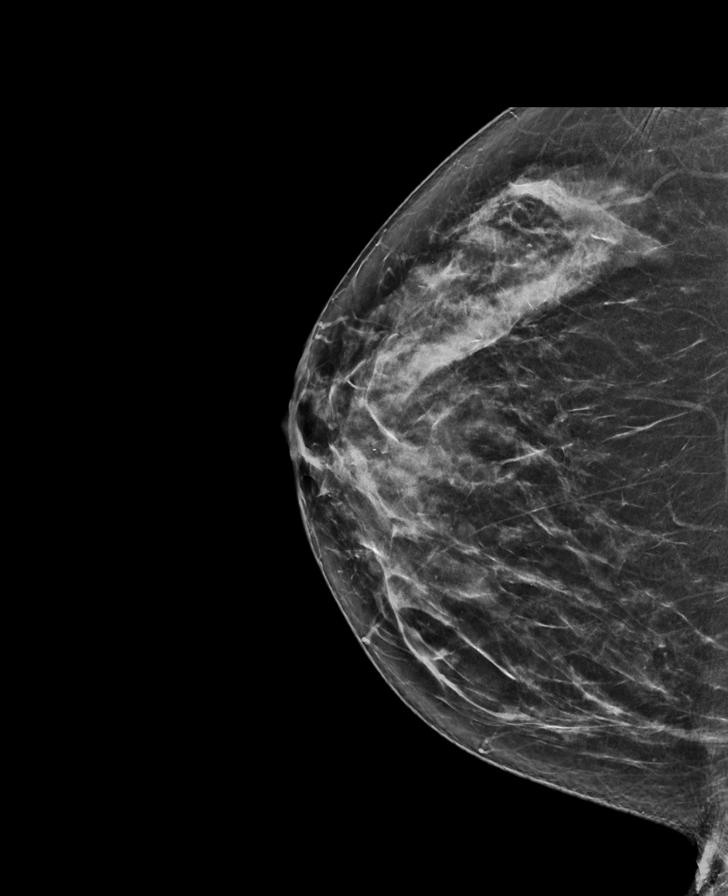

[R MLO synth-2D]
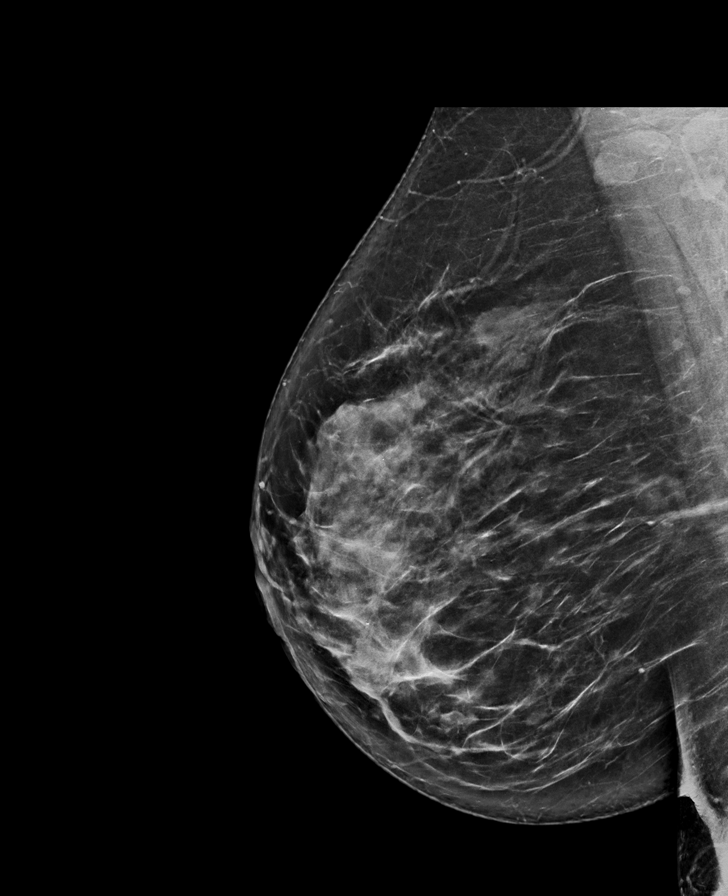

[L MLO synth-2D]
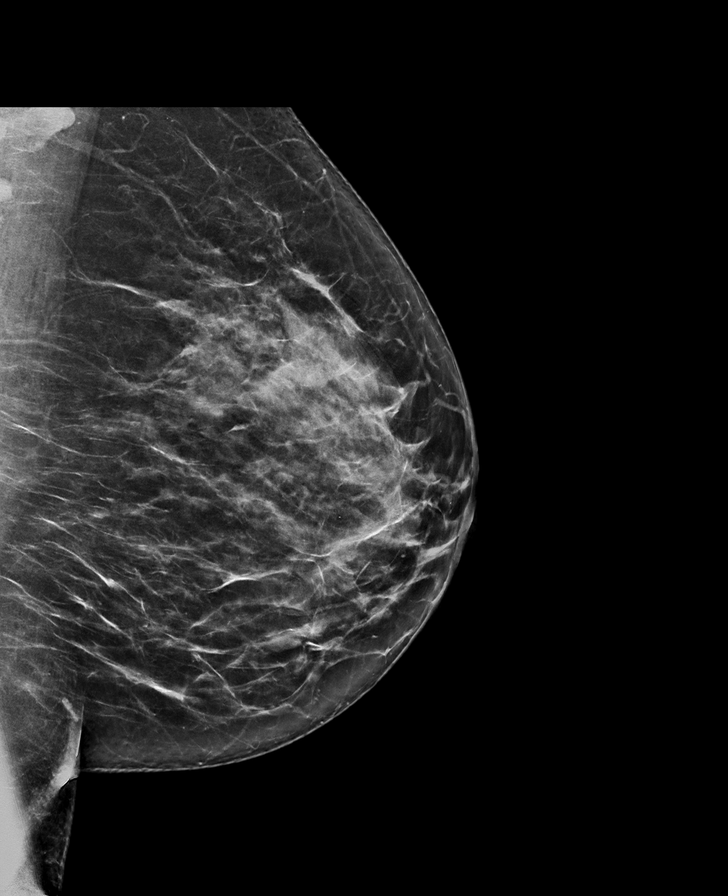

[L CC synth-2D]
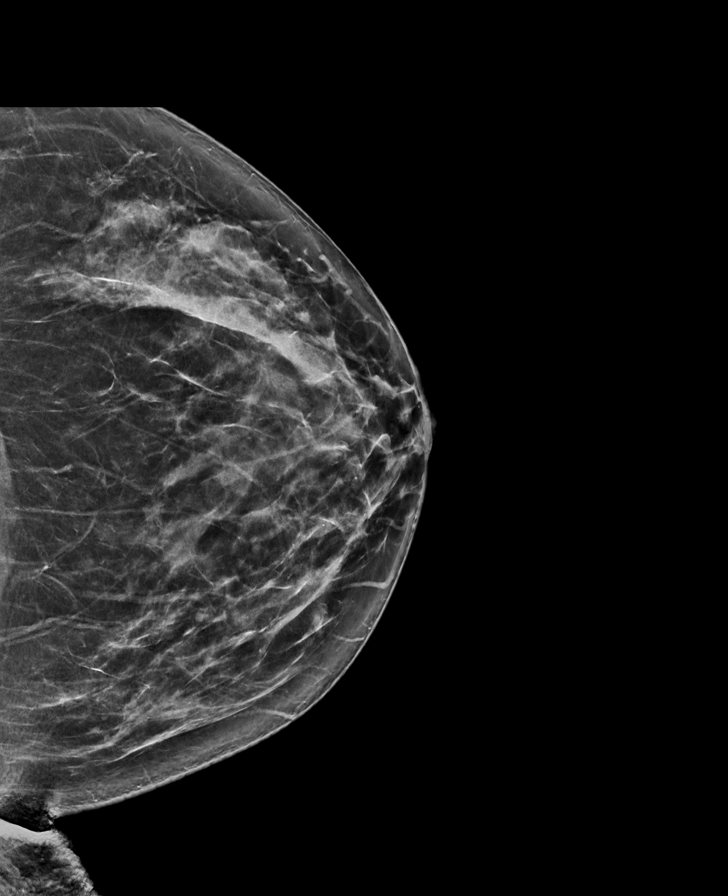

[L CC tomo · tomo slice 40/79.0]
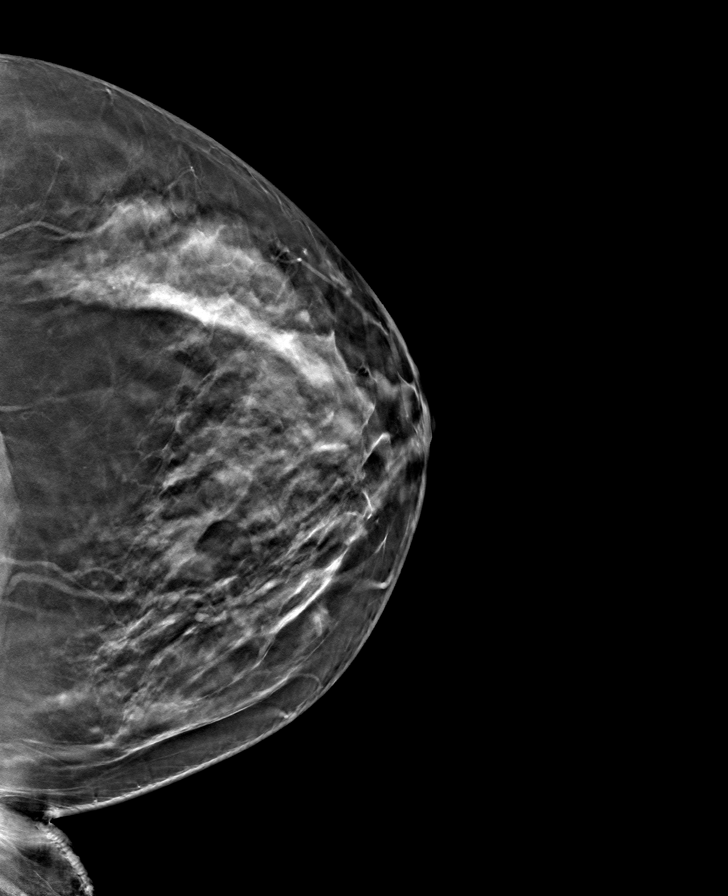

[R CC tomo · tomo slice 39/77.0]
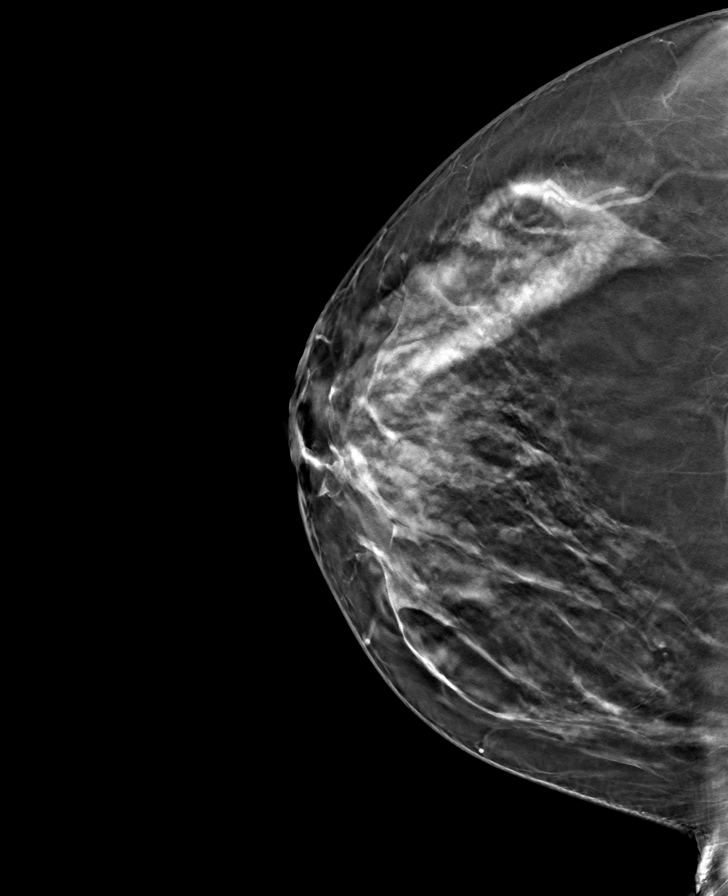

[L MLO tomo · tomo slice 41/81.0]
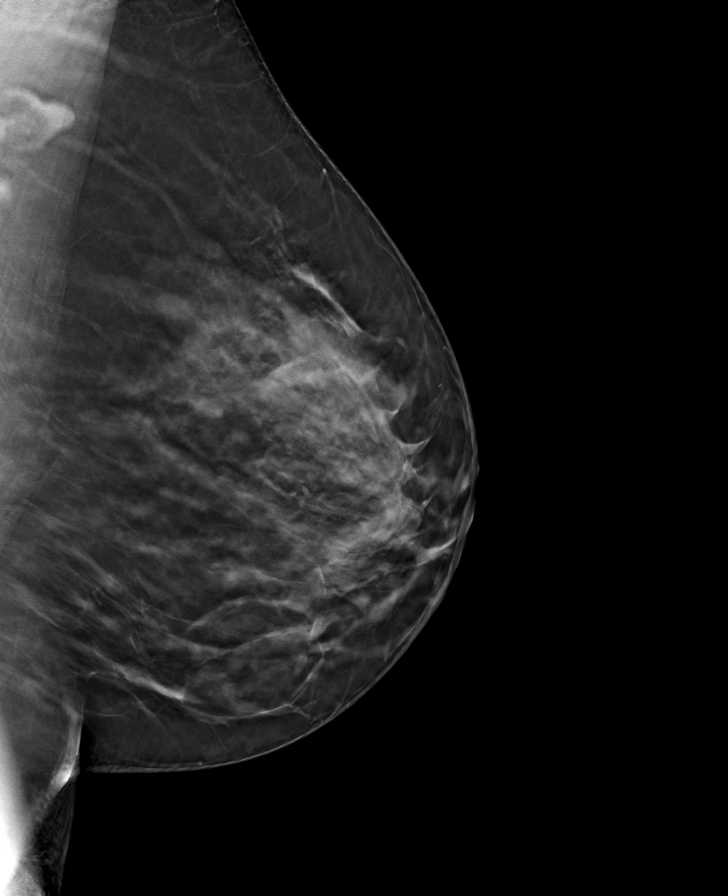

[R MLO tomo · tomo slice 43/86.0]
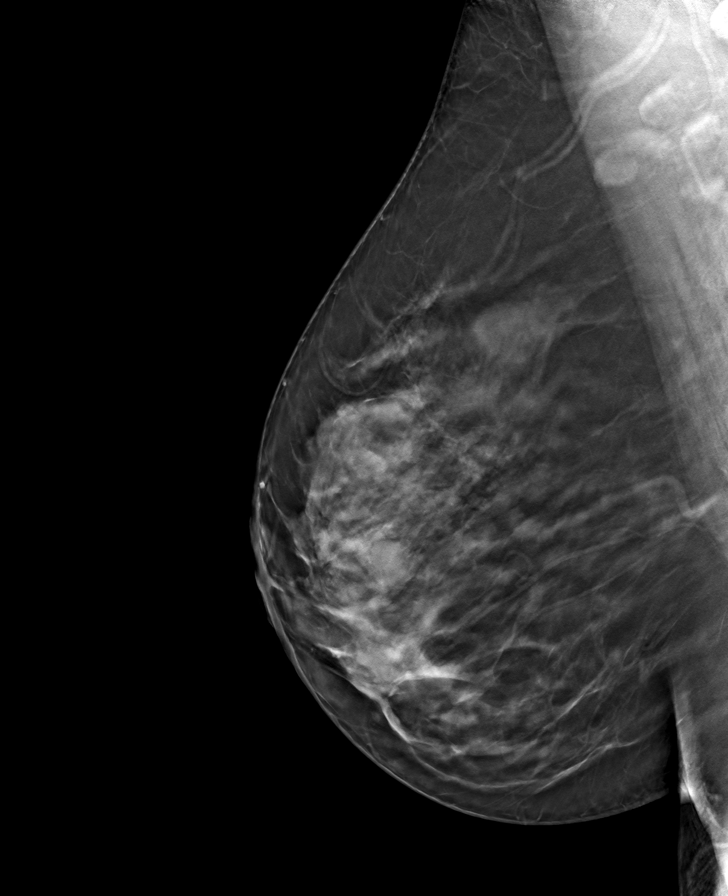

[8 of 24 positions shown; findings below may reference images not displayed]

ACR Breast Density Category c: The breast tissue is heterogeneously
dense, which may obscure small masses.
FINDINGS: There are no findings suspicious for malignancy. Images were
processed with CAD.
IMPRESSION: No mammographic evidence of malignancy. A result letter of this
screening mammogram will be mailed directly to the patient.

RECOMMENDATION:
Screening mammogram in one year. (Code:[id number]-[id number])

BI-RADS CATEGORY  1: Negative.

## 2019-10-22 ENCOUNTER — Other Ambulatory Visit: Payer: Self-pay | Admitting: Family Medicine

## 2019-10-22 ENCOUNTER — Encounter: Payer: Self-pay | Admitting: Family Medicine

## 2019-10-22 ENCOUNTER — Other Ambulatory Visit (HOSPITAL_COMMUNITY)
Admission: RE | Admit: 2019-10-22 | Discharge: 2019-10-22 | Disposition: A | Discharge status: Home / Self Care | Payer: No Typology Code available for payment source | Source: Ambulatory Visit | Attending: Family Medicine | Admitting: Family Medicine

## 2019-10-22 ENCOUNTER — Ambulatory Visit (INDEPENDENT_AMBULATORY_CARE_PROVIDER_SITE_OTHER): Payer: Self-pay | Admitting: Family Medicine

## 2019-10-22 ENCOUNTER — Other Ambulatory Visit: Payer: Self-pay

## 2019-10-22 VITALS — BP 124/86 | HR 97 | Ht 62.0 in | Wt 193.0 lb

## 2019-10-22 DIAGNOSIS — H8110 Benign paroxysmal vertigo, unspecified ear: Secondary | ICD-10-CM

## 2019-10-22 DIAGNOSIS — Z0000 Encounter for general adult medical examination without abnormal findings: Secondary | ICD-10-CM

## 2019-10-22 DIAGNOSIS — Z124 Encounter for screening for malignant neoplasm of cervix: Secondary | ICD-10-CM | POA: Insufficient documentation

## 2019-10-22 DIAGNOSIS — G4730 Sleep apnea, unspecified: Secondary | ICD-10-CM

## 2019-10-22 DIAGNOSIS — Z113 Encounter for screening for infections with a predominantly sexual mode of transmission: Secondary | ICD-10-CM | POA: Insufficient documentation

## 2019-10-22 DIAGNOSIS — Z9189 Other specified personal risk factors, not elsewhere classified: Secondary | ICD-10-CM

## 2019-10-22 DIAGNOSIS — B9689 Other specified bacterial agents as the cause of diseases classified elsewhere: Secondary | ICD-10-CM | POA: Insufficient documentation

## 2019-10-22 DIAGNOSIS — Z1211 Encounter for screening for malignant neoplasm of colon: Secondary | ICD-10-CM

## 2019-10-22 DIAGNOSIS — Z202 Contact with and (suspected) exposure to infections with a predominantly sexual mode of transmission: Secondary | ICD-10-CM

## 2019-10-22 DIAGNOSIS — N760 Acute vaginitis: Secondary | ICD-10-CM

## 2019-10-22 LAB — POCT WET PREP (WET MOUNT)
Clue Cells Wet Prep Whiff POC: POSITIVE
Trichomonas Wet Prep HPF POC: ABSENT

## 2019-10-22 MED ORDER — MECLIZINE HCL 25 MG PO TABS: 25.0000 mg | ORAL_TABLET | Freq: Three times a day (TID) | ORAL | 0 refills | Status: DC | PRN

## 2019-10-22 MED ORDER — METRONIDAZOLE 500 MG PO TABS: 500.0000 mg | ORAL_TABLET | Freq: Three times a day (TID) | ORAL | 0 refills | Status: DC

## 2019-10-22 NOTE — Assessment & Plan Note (Signed)
Wet prep with positive whiff test and clue cells consistent with BV.  -prescribed metronidazole 500 mg TID for seven days

## 2019-10-22 NOTE — Assessment & Plan Note (Addendum)
-  Test for gonorrhea and chlamydia  -will notify patient of results

## 2019-10-22 NOTE — Assessment & Plan Note (Signed)
Prescribed meclizine 25mg 

## 2019-10-22 NOTE — Assessment & Plan Note (Signed)
Observed occurrences concerning for sleep apnea. Patient may have obstructive type (BMI 35) of central, less likely.  -referral to sleep medicine

## 2019-10-22 NOTE — Assessment & Plan Note (Signed)
-  Pap smear completed  -referral for colonoscopy sent  -patient declined covid 19 vaccination

## 2019-10-22 NOTE — Patient Instructions (Addendum)
It was a pleasure to see you today! Thank you for choosing [facility] for your primary care.  [name] was seen for pap smear and testing for sexually transmitted diseases.   Our plans for today were:  Completed your pap smear. We will notify you of results.   I will also notify you of test results.   I have sent a referral for a sleep study. Please let us know if you don't hear anything from them in the next two weeks for scheduling a sleep study.   To keep you healthy, please keep in mind the following health maintenance items that you are due for:   1. COVID vaccine - patient declines.  2. Referral sent for colonoscopy. Please let us know if you do not hear from anyone from scheduling within the next two weeks.    You should return to our clinic as needed.   Best Wishes,  Dr. Sharol Harness

## 2019-10-22 NOTE — Progress Notes (Unsigned)
Prescribed metronidazole 500mg  three times daily for seven days for bacterial vaginosis.

## 2019-10-22 NOTE — Progress Notes (Signed)
    SUBJECTIVE:   CHIEF COMPLAINT / HPI: pap smear and STI testing   Health Maintenance Patient reports for pap smear, last pap smear normal. Patient declines covid vaccine. Discussed recommendation for colonoscopy given patient's mother's history of multiple forms of cancer including being diagnosed with colon cancer at age 43. Patient agreeable to proceed with colonoscopy.   Concern for Sleep Apnea  [name] states that her brother noticed her making strange noises while sleeping and observed her for an hour while she slept. She states that he counted five occurrences where she seemed to stop breathing while sleeping. We discussed possibility of sleep apnea and need for a sleep study. Patient was agreeable to this plan.   Screening for STI  Patient reports a recent partner with infidelity and would like to be tested for sexually transmitted infections. Patient denies any vaginal discharge or abnormal bleeding. Denies any genital skin changes, increased urinary frequency or dysuria.   BPPV Patient reports that she continues to have problems with her vertigo and has been regularly taking meclizine to help with her symptoms. 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 Patient offered vestibular therapy referral but declines and she has heard from others that it may not work. Patient continued to decline after sharing stories of prior patients with success. Patient requests refill for meclizine.   PERTINENT  PMH / PSH: BPPV   OBJECTIVE:   BP 124/86   Pulse 97   Ht 5\' 2"  (1.575 m)   Wt 193 lb (87.5 kg)   LMP 10/21/2019 (Approximate) Comment: started spotting  SpO2 99%   BMI 35.30 kg/m   General: female appearing stated age in no acute distress Cardio: Normal S1 and S2, no S3 or S4. Rhythm is regular. No murmurs or rubs.  Bilateral radial pulses palpable Pulm: Clear to auscultation bilaterally, no  crackles, wheezing, or diminished breath sounds. Normal respiratory effort, stable on RA Abdomen: Bowel sounds normal. Abdomen soft and non-tender.  GU: no external genital lesions noted, some menses blood pooled in vaginal vault, unable to visualize any cervical lesions or signs of inflammation  ASSESSMENT/PLAN:   BPPV (benign paroxysmal positional vertigo) Prescribed meclizine 25mg    Healthcare maintenance -Pap smear completed  -referral for colonoscopy sent  -patient declined covid 19 vaccination   At risk for obstructive sleep apnea Observed occurrences concerning for sleep apnea. Patient may have obstructive type (BMI 35) of central, less likely.  -referral to sleep medicine   Routine screening for STI (sexually transmitted infection) -Test for gonorrhea and chlamydia  -will notify patient of results   BV (bacterial vaginosis) Wet prep with positive whiff test and clue cells consistent with BV.  -prescribed metronidazole 500 mg TID for seven days     10/23/2019, MD [facility]

## 2019-10-27 LAB — CYTOLOGY - PAP
Adequacy: ABSENT
Chlamydia: NEGATIVE
Comment: NEGATIVE
Comment: NEGATIVE
Comment: NORMAL
Diagnosis: NEGATIVE
High risk HPV: NEGATIVE
Neisseria Gonorrhea: NEGATIVE

## 2019-10-28 ENCOUNTER — Encounter: Payer: Self-pay | Admitting: Gastroenterology

## 2019-11-15 ENCOUNTER — Encounter: Payer: Self-pay | Admitting: Neurology

## 2019-11-15 ENCOUNTER — Other Ambulatory Visit: Payer: Self-pay

## 2019-11-15 ENCOUNTER — Ambulatory Visit: Payer: Self-pay | Admitting: Neurology

## 2019-11-15 VITALS — BP 137/92 | HR 85 | Ht 62.5 in | Wt 192.0 lb

## 2019-11-15 DIAGNOSIS — Z6834 Body mass index (BMI) 34.0-34.9, adult: Secondary | ICD-10-CM

## 2019-11-15 DIAGNOSIS — F519 Sleep disorder not due to a substance or known physiological condition, unspecified: Secondary | ICD-10-CM | POA: Insufficient documentation

## 2019-11-15 DIAGNOSIS — R0683 Snoring: Secondary | ICD-10-CM

## 2019-11-15 DIAGNOSIS — E6609 Other obesity due to excess calories: Secondary | ICD-10-CM

## 2019-11-15 DIAGNOSIS — K219 Gastro-esophageal reflux disease without esophagitis: Secondary | ICD-10-CM

## 2019-11-15 DIAGNOSIS — R079 Chest pain, unspecified: Secondary | ICD-10-CM | POA: Insufficient documentation

## 2019-11-15 DIAGNOSIS — F17200 Nicotine dependence, unspecified, uncomplicated: Secondary | ICD-10-CM

## 2019-11-15 DIAGNOSIS — F5109 Other insomnia not due to a substance or known physiological condition: Secondary | ICD-10-CM

## 2019-11-15 NOTE — Progress Notes (Signed)
SLEEP MEDICINE CLINIC    Provider:  Larey Seat, MD  Primary Care Physician:  Stark Klein, MD [address] [address]     Referring Provider: Stark Klein, [address] [place],  [place] [address]          Chief Complaint according to patient   Patient presents with:    . New Patient (Initial Visit)     complaints of having to wake up frequently trying to catch her breath. she describes that it can take a while before able to fall back asleep. she does know she snores and has never had a PSG. complains of waking up tired despite sleeping enough hours in the night      HISTORY OF PRESENT ILLNESS:  WINOLA DRUM is a 44 year- old Caucasian female patient and seen here upon her FP MD's  referral on 6/21/2021Chief concern according to patient :  " I fall asleep in front Tv, I have always to be stimulated or physically active to not go to sleep".    I have the pleasure of seeing KIYA ENO today, a  Right -handed Caucasian female with a possible sleep disorder and has a history of non seasonal vertigo.     Sleep relevant medical history: Nocturia; none, sleep arousal headaches - none, no Tonsillectomy.   Family medical /sleep history: mother with OSA, insomnia, sleep walkers.    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   The patient currently works/ but used to work night shifts in the past.  Pets are  present. Tobacco use- less than 1 ppd. 25 years .   ETOH use; none , Caffeine intake in form of Coffee(  3 cups ) Soda( rare ) Tea ( all day) or energy drinks. Regular exercise- push mower .   Hobbies : puzzles.       Sleep habits are as follows: The patient's dinner time is between 5-6 PM. The patient goes to bed at 10 PM and continues to sleep for 2-4 hours, than wakes for any reasons- not necessarily bathroom breaks, the  first time at 1 AM .   The preferred sleep position is sideways, with the support of 2-3 pillows. Dreams are reportedly rare/ frequent/vivid.  4 AM is the usual rise time. The patient wakes up spontaneously with an alarm.  She reports not feeling refreshed or restored in AM, with symptoms such as dry mouth, and residual fatigue. Naps are taken frequently, lasting for the rest of the day - as refreshing as nocturnal sleep.    Review of Systems: Out of a complete 14 system review, the patient complains of only the following symptoms, and all other reviewed systems are negative.:   Not vaccinated against COVID 19.   Dysphagia - acid reflux starting 2021.  Fatigue, sleepiness , snoring, fragmented sleep, Insomnia - early morning awakenings.    How likely are you to doze in the following situations: 0 = not likely, 1 = slight chance, 2 = moderate chance, 3 = high chance   Sitting and Reading? Watching Television? Sitting inactive in a public place (theater or meeting)? As a passenger in a car for an hour without a break? Lying down in the afternoon when circumstances permit? Sitting and talking to someone? Sitting quietly after lunch without alcohol? In a car, while  stopped for a few minutes in traffic?   Total = 17/ 24 points   FSS endorsed at 35/ 63 points.   Social History   Socioeconomic History  . Marital status: Single    Spouse name: Not on file  . Number of children: 2  . Years of education: Not on file  . Highest education level: Bachelor's degree (e.g., BA, AB, BS)  Occupational History  . Not on file  Tobacco Use  . Smoking status: Current Every Day Smoker    Packs/day: 0.75    Types: Cigarettes  . Smokeless tobacco: Never Used  Vaping Use  . Vaping Use: Never used  Substance and Sexual Activity  . Alcohol use: No  . Drug use: No  . Sexual activity: Yes    Birth control/protection: Surgical  Other Topics Concern  . Not on file  Social History Narrative  .  Not on file   Social Determinants of Health   Financial Resource Strain:   . Difficulty of Paying Living Expenses:   Food Insecurity:   . Worried About Programme researcher, broadcasting/film/video in the Last Year:   . Barista in the Last Year:   Transportation Needs:   . Freight forwarder (Medical):   Marland Kitchen Lack of Transportation (Non-Medical):   Physical Activity:   . Days of Exercise per Week:   . Minutes of Exercise per Session:   Stress:   . Feeling of Stress :   Social Connections:   . Frequency of Communication with Friends and Family:   . Frequency of Social Gatherings with Friends and Family:   . Attends Religious Services:   . Active Member of Clubs or Organizations:   . Attends Banker Meetings:   Marland Kitchen Marital Status:     Family History  Problem Relation Age of Onset  . Cancer Mother        mother breast ca at 92 yoa, and colon ca at 9 yoa  . Hypertension Mother   . Hypertension Brother   . Diabetes Brother     Past Medical History:  Diagnosis Date  . Vertigo   . Vertigo     Past Surgical History:  Procedure Laterality Date  . TUBAL LIGATION       Current Outpatient Medications on File Prior to Visit  Medication Sig Dispense Refill  . meclizine (ANTIVERT) 25 MG tablet Take 1 tablet (25 mg total) by mouth 3 (three) times daily as needed for dizziness. 30 tablet 0   No current facility-administered medications on file prior to visit.   Physical exam:  Today's Vitals   11/15/19 1110  BP: (!) 137/92  Pulse: 85  Weight: 192 lb (87.1 kg)  Height: 5' 2.5" (1.588 m)   Body mass index is 34.56 kg/m.   Wt Readings from Last 3 Encounters:  11/15/19 192 lb (87.1 kg)  10/22/19 193 lb (87.5 kg)  08/14/18 189 lb 4 oz (85.8 kg)     Ht Readings from Last 3 Encounters:  11/15/19 5' 2.5" (1.588 m)  10/22/19 5\' 2"  (1.575 m)  04/03/18 5\' 2"  (1.575 m)      General: The patient is awake, alert and appears not in acute distress. The patient is well  groomed. Head: Normocephalic, atraumatic. Neck is supple. Mallampati: 2 plus- red and swollen uvula.   neck circumference:16 inches . Nasal airflow patent.  Retrognathia is not seen.  Cardiovascular:  Regular rate and cardiac rhythm by pulse,  without distended neck  veins. Respiratory: Lungs are clear to auscultation.  Skin:  Without evidence of ankle edema, reddish face.  Trunk: The patient's posture is erect.   Neurologic exam : The patient is awake and alert, oriented to place and time.   Memory subjective described as intact.  Attention span & concentration ability appears normal.  Speech is fluent,  without  dysarthria, dysphonia or aphasia.  Mood and affect are appropriate.   Cranial nerves: no loss of smell or taste reported  Pupils are equal and briskly reactive to light. Funduscopic exam deferred. .  Extraocular movements in vertical and horizontal planes were intact and without nystagmus. No Diplopia. Visual fields by finger perimetry are intact. Hearing was intact to soft voice and finger rubbing.    Facial sensation intact to fine touch.  Facial motor strength is symmetric and tongue and uvula move midline.  Neck ROM : rotation, tilt and flexion extension were normal for age and shoulder shrug was symmetrical.    Motor exam:  Symmetric bulk, tone and ROM.   Normal tone without cog wheeling, symmetric grip strength .  She reports having had multiple ankle injuries.    Sensory:  Fine touch, pinprick and vibration were tested  and  normal.  Proprioception tested in the upper extremities was normal.   Coordination: Rapid alternating movements in the fingers/hands were of normal speed.  The Finger-to-nose maneuver was intact without evidence of ataxia, dysmetria or tremor.   Gait and station: Patient could rise unassisted from a seated position, walked without assistive device.  Stance is of normal width/ base and the patient turned with 3 steps.  Toe and heel walk were  deferred.  Deep tendon reflexes: in the  upper and lower extremities are symmetric and intact.  Babinski response was deferred normal       After spending a total time of 45  minutes face to face and additional time for physical and neurologic examination, review of laboratory studies,  personal review of imaging studies, reports and results of other testing and review of referral information / records as far as provided in visit, I have established the following assessments:  Mrs. Eurydice Calixto has multiple risk factors for the presence of obstructive sleep apnea and she has woken herself gasping for air or choking she also reports having acid reflux symptoms and dysphagia.  Her BMI has stopped short of 35 kg/m she does have at the baseline today and elevated blood pressure, she has a swollen soft tissue at the throat soft palate, all this can likely be related to acid reflux as well as to apnea.  Loud snoring has been witnessed by her children.  She has continued to smoke for many years but at this time consumes less than 1 pack a day.  I will order a home sleep test for the patient as this is a financially most viable option.   My Plan is to proceed with:  1) HST -  2) vaccine education needed.  3) quit smoking education.   I would like to thank Nicki Guadalajara, Md [address] [place],  [place] [address] for allowing me to meet with and to take care of this pleasant patient.   In short, SAYLOR MURRY is presenting with tobacco abuse, obesity and GERD, her symptoms that can be attributed to OSA and GERD.  A follow up through our NP within 2-3  month is planned.   CC: I will share my notes with PCP .  Electronically signed by: Melvyn Novas, MD 11/15/2019 11:42 AM  [facility] Board certified by The [employer] of Sleep Medicine and Diplomate of the [employer] of Sleep Medicine. Board certified In Neurology through the ABPN,  Fellow of the [employer] of Neu[employer]. Medical Director of [facility].

## 2019-11-15 NOTE — Patient Instructions (Signed)

## 2019-11-19 ENCOUNTER — Other Ambulatory Visit: Payer: Self-pay | Admitting: Family Medicine

## 2019-11-19 MED ORDER — MECLIZINE HCL 25 MG PO TABS: 25.0000 mg | ORAL_TABLET | Freq: Three times a day (TID) | ORAL | 0 refills | Status: DC | PRN

## 2019-12-07 ENCOUNTER — Other Ambulatory Visit (HOSPITAL_COMMUNITY)
Admission: RE | Admit: 2019-12-07 | Discharge: 2019-12-07 | Disposition: A | Discharge status: Home / Self Care | Payer: No Typology Code available for payment source | Source: Ambulatory Visit | Attending: Family Medicine | Admitting: Family Medicine

## 2019-12-07 ENCOUNTER — Other Ambulatory Visit: Payer: Self-pay

## 2019-12-07 ENCOUNTER — Encounter: Payer: Self-pay | Admitting: Family Medicine

## 2019-12-07 ENCOUNTER — Ambulatory Visit (INDEPENDENT_AMBULATORY_CARE_PROVIDER_SITE_OTHER): Payer: Self-pay | Admitting: Family Medicine

## 2019-12-07 VITALS — BP 115/80 | HR 82 | Ht 61.0 in | Wt 191.4 lb

## 2019-12-07 DIAGNOSIS — R8290 Unspecified abnormal findings in urine: Secondary | ICD-10-CM

## 2019-12-07 DIAGNOSIS — N898 Other specified noninflammatory disorders of vagina: Secondary | ICD-10-CM | POA: Insufficient documentation

## 2019-12-07 DIAGNOSIS — B379 Candidiasis, unspecified: Secondary | ICD-10-CM

## 2019-12-07 LAB — POCT WET PREP (WET MOUNT)
Clue Cells Wet Prep Whiff POC: NEGATIVE
Trichomonas Wet Prep HPF POC: ABSENT

## 2019-12-07 LAB — POCT URINALYSIS DIP (MANUAL ENTRY)
Bilirubin, UA: NEGATIVE
Blood, UA: NEGATIVE
Glucose, UA: NEGATIVE mg/dL
Ketones, POC UA: NEGATIVE mg/dL
Leukocytes, UA: NEGATIVE
Nitrite, UA: NEGATIVE
Protein Ur, POC: NEGATIVE mg/dL
Spec Grav, UA: 1.025 (ref 1.010–1.025)
Urobilinogen, UA: 0.2 E.U./dL
pH, UA: 6 (ref 5.0–8.0)

## 2019-12-07 MED ORDER — FLUCONAZOLE 150 MG PO TABS: 150.0000 mg | ORAL_TABLET | Freq: Once | ORAL | 0 refills | Status: AC

## 2019-12-07 NOTE — Progress Notes (Signed)
    SUBJECTIVE:   CHIEF COMPLAINT / HPI:  [name] is a 44 year old female presenting for the issues below.  Vaginal itching Patient presents after several weeks of continued itchiness was found to have BV at her last visit was treated with 7 days of Flagyl.  Continues to have itchiness she attempted to try over-the-counter yeast infection vaginal pills.  Attributes yeast infection due to antibiotic treatments for BV.  Also endorses cloudy urine.  But denies dysuria.  LMP 11/15/2019.  Endorses condom use during sexual activity.  Would like to be screened for gonorrhea chlamydia trichomonas BV and yeast.  Declined testing for HIV and syphilis. No other concerns today.  PERTINENT  PMH / PSH: BV (treated 10/22/19 w/ 7 days of flagyl), Hx of UTI (recurrent last treated 12/2018 with Keflex)  OBJECTIVE:   BP 115/80   Pulse 82   Ht 5\' 1"  (1.549 m)   Wt 191 lb 6.4 oz (86.8 kg)   LMP 11/15/2019 (Approximate)   SpO2 99%   BMI 36.16 kg/m   General: Appears well, no acute distress. Age appropriate. Pelvic exam: normal external genitalia, vulva, vagina, cervix, uterus and adnexa, WET MOUNT done - results: KOH done, yeast present, DNA probe for chlamydia and GC obtained, exam chaperoned by 11/17/2019, CMA.  ASSESSMENT/PLAN:   Vaginal itching Normal pelvic exam. Wet mount positive for yeast infection. UA nml.  -f/u STD testing today gonorrhea, chlamydia, trichomonas -Patient informed she will be notified if labs abnormal via phone if normal via MyChart; called with results of yeast infection and nml UA -150 mg diflucan oral sent to pharmacy    Gilberto Better, DO [facility]

## 2019-12-07 NOTE — Patient Instructions (Signed)
I will call you with abnormal results. If normal I will notify you through Mychart.   Please call the clinic at [phone] if your symptoms worsen or you have any concerns. It was our pleasure to serve you.

## 2019-12-07 NOTE — Assessment & Plan Note (Addendum)
Normal pelvic exam. Wet mount positive for yeast infection. UA nml.  -f/u STD testing today gonorrhea, chlamydia, trichomonas -Patient informed she will be notified if labs abnormal via phone if normal via MyChart; called with results of yeast infection and nml UA -150 mg diflucan oral sent to pharmacy

## 2019-12-08 LAB — CERVICOVAGINAL ANCILLARY ONLY
Chlamydia: NEGATIVE
Comment: NEGATIVE
Comment: NORMAL
Neisseria Gonorrhea: NEGATIVE

## 2019-12-09 LAB — URINE CULTURE: Organism ID, Bacteria: NO GROWTH

## 2019-12-14 ENCOUNTER — Ambulatory Visit: Payer: No Typology Code available for payment source | Admitting: Gastroenterology

## 2019-12-31 ENCOUNTER — Encounter (HOSPITAL_COMMUNITY): Payer: Self-pay | Admitting: Emergency Medicine

## 2019-12-31 ENCOUNTER — Emergency Department (HOSPITAL_COMMUNITY)
Admission: EM | Admit: 2019-12-31 | Discharge: 2019-12-31 | Disposition: A | Discharge status: Home / Self Care | Payer: No Typology Code available for payment source | Attending: Emergency Medicine | Admitting: Emergency Medicine

## 2019-12-31 ENCOUNTER — Other Ambulatory Visit: Payer: Self-pay

## 2019-12-31 DIAGNOSIS — F17210 Nicotine dependence, cigarettes, uncomplicated: Secondary | ICD-10-CM | POA: Insufficient documentation

## 2019-12-31 DIAGNOSIS — R079 Chest pain, unspecified: Secondary | ICD-10-CM | POA: Insufficient documentation

## 2019-12-31 DIAGNOSIS — T782XXA Anaphylactic shock, unspecified, initial encounter: Secondary | ICD-10-CM

## 2019-12-31 DIAGNOSIS — L509 Urticaria, unspecified: Secondary | ICD-10-CM | POA: Insufficient documentation

## 2019-12-31 DIAGNOSIS — R0602 Shortness of breath: Secondary | ICD-10-CM | POA: Insufficient documentation

## 2019-12-31 DIAGNOSIS — T7840XA Allergy, unspecified, initial encounter: Secondary | ICD-10-CM | POA: Insufficient documentation

## 2019-12-31 MED ORDER — FAMOTIDINE IN NACL 20-0.9 MG/50ML-% IV SOLN
20.0000 mg | Freq: Once | INTRAVENOUS | Status: AC
  Administered 2019-12-31: 20 mg via INTRAVENOUS
  Filled 2019-12-31: qty 50

## 2019-12-31 MED ORDER — EPINEPHRINE 0.3 MG/0.3ML IJ SOAJ: 0.3000 mg | INTRAMUSCULAR | 0 refills | Status: DC | PRN

## 2019-12-31 MED ORDER — EPINEPHRINE 1 MG/10ML IJ SOSY
0.3000 mg | PREFILLED_SYRINGE | Freq: Once | INTRAMUSCULAR | Status: AC
  Administered 2019-12-31: 0.3 mg via INTRAMUSCULAR
  Filled 2019-12-31: qty 10

## 2019-12-31 MED ORDER — HYDROXYZINE HCL 25 MG PO TABS
25.0000 mg | ORAL_TABLET | Freq: Once | ORAL | Status: AC
  Administered 2019-12-31: 25 mg via ORAL
  Filled 2019-12-31: qty 1

## 2019-12-31 NOTE — ED Provider Notes (Signed)
[facility] EMERGENCY DEPARTMENT Provider Note   CSN: [id number] Arrival date & time: 12/31/19  1548     History Chief Complaint  Patient presents with  . Allergic Reaction    [name] is a 44 y.o. female.  HPI  Patient presents to the emergency department for concern over allergic reaction via EMS.  The patient was reportedly stung by a bee sometime around 1500.  EMS responded.  Patient had multiple complaints including shortness of breath, nausea/vomiting, urticaria, and chest tightness.  Given these concerns, the patient was treated for anaphylaxis.  She received IV Solu-Medrol just prior to arrival 125 mg as well as IM epinephrine 0.3 mg, and IV Benadryl 50 mg.  Epinephrine was of approximately 1525.  Patient reports mild improvement in her symptoms since receiving the medications.  She continues to endorse a scratchy sensation in her throat.  No previous history of lung disease per the patient although she is a daily smoker.  No previous allergies to bee stings.     Past Medical History:  Diagnosis Date  . Vertigo   . Vertigo     Patient Active Problem List   Diagnosis Date Noted  . Vaginal itching 12/07/2019  . 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 Loud snoring 11/15/2019  . Chest pain due to GERD 11/15/2019  . At risk for obstructive sleep apnea 10/22/2019  . Routine screening for STI (sexually transmitted infection) 10/22/2019  . BV (bacterial vaginosis) 10/22/2019  . Laceration of left ankle 08/14/2018  . Need for Tdap vaccination 08/14/2018  . UTI symptoms 06/19/2018  . Urinary tract infection 10/22/2017  . Healthcare maintenance 08/12/2014  . Obesity 06/08/2012  . BPPV (benign paroxysmal positional vertigo) 12/10/2011  . Family history of breast cancer in first degree relative 08/30/2010  . Family history of colon cancer 08/30/2010  . DUB (dysfunctional uterine bleeding) 08/14/2010  . HYPERLIPIDEMIA 04/10/2010  . TOBACCO DEPENDENCE  07/24/2006    Past Surgical History:  Procedure Laterality Date  . TUBAL LIGATION       OB History    Gravida  3   Para      Term      Preterm      AB  1   Living  2     SAB      TAB  1   Ectopic      Multiple      Live Births  2           Family History  Problem Relation Age of Onset  . Cancer Mother        mother breast ca at 47 yoa, and colon ca at 73 yoa  . Hypertension Mother   . Hypertension Brother   . Diabetes Brother     Social History   Tobacco Use  . Smoking status: Current Every Day Smoker    Packs/day: 0.75    Types: Cigarettes  . Smokeless tobacco: Never Used  Vaping Use  . Vaping Use: Never used  Substance Use Topics  . Alcohol use: No  . Drug use: No    Home Medications Prior to Admission medications   Medication Sig Start Date End Date Taking? Authorizing Provider  meclizine (ANTIVERT) 25 MG tablet Take 1 tablet (25 mg total) by mouth 3 (three) times daily as needed for dizziness. Patient taking differently: Take 25 mg by mouth daily.  11/19/19  Yes Simmons-Robinson, Makiera, MD  EPINEPHrine 0.3 mg/0.3 mL IJ SOAJ injection Inject 0.3  mLs (0.3 mg total) into the muscle as needed for anaphylaxis. 12/31/19   Nino Parsley, MD    Allergies    Bee venom  Review of Systems   Review of Systems  Constitutional: Negative for chills and fever.  HENT: Negative for ear pain and sore throat.   Eyes: Negative for pain and visual disturbance.  Respiratory: Positive for shortness of breath. Negative for cough.   Cardiovascular: Positive for chest pain. Negative for palpitations.  Gastrointestinal: Positive for nausea and vomiting. Negative for abdominal pain.  Genitourinary: Negative for dysuria and hematuria.  Musculoskeletal: Negative for arthralgias and back pain.  Skin: Positive for rash. Negative for color change.  Neurological: Negative for seizures and syncope.  All other systems reviewed and are negative.   Physical  Exam Updated Vital Signs BP 110/72   Pulse 89   Resp 20   SpO2 98%   Physical Exam Vitals and nursing note reviewed.  Constitutional:      General: She is not in acute distress.    Appearance: Normal appearance. She is well-developed and normal weight. She is ill-appearing. She is not toxic-appearing.  HENT:     Head: Normocephalic and atraumatic.     Right Ear: Tympanic membrane normal.     Left Ear: Tympanic membrane normal.     Mouth/Throat:     Mouth: Mucous membranes are moist.     Pharynx: Oropharynx is clear. No pharyngeal swelling.  Eyes:     Conjunctiva/sclera: Conjunctivae normal.  Cardiovascular:     Rate and Rhythm: Normal rate and regular rhythm.     Heart sounds: No murmur heard.   Pulmonary:     Effort: Pulmonary effort is normal. No respiratory distress.     Breath sounds: Normal breath sounds. No wheezing.  Abdominal:     Palpations: Abdomen is soft.     Tenderness: There is no abdominal tenderness.  Musculoskeletal:     Cervical back: Neck supple.  Skin:    General: Skin is warm and dry.     Capillary Refill: Capillary refill takes less than 2 seconds.     Findings: Erythema and rash present. Rash is urticarial.  Neurological:     General: No focal deficit present.     Mental Status: She is alert and oriented to person, place, and time. Mental status is at baseline.  Psychiatric:        Mood and Affect: Mood normal.        Behavior: Behavior normal.     ED Results / Procedures / Treatments   Labs (all labs ordered are listed, but only abnormal results are displayed) Labs Reviewed - No data to display  EKG None  Radiology No results found.  Procedures Procedures (including critical care time)  Medications Ordered in ED Medications  hydrOXYzine (ATARAX/VISTARIL) tablet 25 mg (25 mg Oral Given 12/31/19 1634)  famotidine (PEPCID) IVPB 20 mg premix (0 mg Intravenous Stopped 12/31/19 1710)  EPINEPHrine (ADRENALIN) 1 MG/10ML injection 0.3 mg (0.3  mg Intramuscular Given 12/31/19 1651)    ED Course   [name] is a 44 y.o. female with PMHx listed that presents to the Emergency Department complaint of Allergic Reaction    ED Course: Initial exam completed.   Ill-appearing but hemodynamically stable.  Nontoxic and afebrile.  Physical exam significant for 43 year old female who is age-appropriate with some mild bradycardia on examination, clear breath sounds bilaterally, no oropharynx swelling, no angioedema, no evidence of respiratory distress on my examination.  Presentation is consistent with anaphylactic reaction given the multiple system involvement.  Previously received epinephrine, Benadryl, and Solu-Medrol with EMS.  We will proceed with additional treatment including Pepcid and Atarax with close monitoring.  Patient subsequently had worsening of her throat tightness however had no visible angioedema; given the subjective symptoms, will proceed with additional IM epinephrine treatment.  Heart rate normalized and patient remained hemodynamically stable.  Patient was observed for a 4-hour period of observation with resolution of her symptoms.  Urticaria resolved.  Throat/tongue sensation resolved.  No evidence of angioedema.  Clear breath sounds bilaterally.  Given the patient's anaphylaxis today, prescription given for epinephrine pen.  She was instructed if used she must present to the emergency department for evaluation immediately.   Diagnostics Vital Signs: reviewed Records: nursing notes along with previous records reviewed and pertinent data discussed   Consults:  None   Reevaluation/Disposition:  Upon reevaluation, patients symptoms stable/improved. No active nausea/vomiting and ambulatory without assistance prior to discharge from the emergency department.    All questions answered.  Strict return precautions were discussed. Additionally we discussed establishing and/or following-up with primary care physician.  Patient  and/or family was understanding and in agreement with today's assessment and plan.   Campbell Riches, MD Emergency Medicine, PGY-3   Note: Dragon medical dictation software was used in the creation of this note.  Final Clinical Impression(s) / ED Diagnoses Final diagnoses:  None    Rx / DC Orders ED Discharge Orders         Ordered    EPINEPHrine 0.3 mg/0.3 mL IJ SOAJ injection  As needed     Discontinue  Reprint     12/31/19 1627           Nino Parsley, MD 01/01/20 0000    Jacalyn Lefevre, MD 01/05/20 [phone]

## 2019-12-31 NOTE — ED Triage Notes (Signed)
Pt BIB [facility] from home, pt was working in her yard when she was stung by an insect, developed shortness of breath, tingling to her extremities, hives and some nausea. EMS reports pt initially had wheezing in her upper lobes. Given 0.3mg  epi, 50mg  IV benadryl and 125mg  solumedrol IV pta.

## 2019-12-31 NOTE — ED Notes (Signed)
Pt st's feels like her tongue is swelling.  Tongue does not appear swollen to me.  Dr. Michaell Cowing made aware and will re-evaluate pt.

## 2019-12-31 NOTE — ED Notes (Signed)
Pt st's tongue feels better at this time

## 2019-12-31 NOTE — ED Notes (Signed)
Pt vomited small amount of liquid.

## 2020-01-03 ENCOUNTER — Ambulatory Visit (INDEPENDENT_AMBULATORY_CARE_PROVIDER_SITE_OTHER): Payer: Self-pay | Admitting: Neurology

## 2020-01-03 DIAGNOSIS — F5109 Other insomnia not due to a substance or known physiological condition: Secondary | ICD-10-CM

## 2020-01-03 DIAGNOSIS — G4733 Obstructive sleep apnea (adult) (pediatric): Secondary | ICD-10-CM

## 2020-01-03 DIAGNOSIS — R0683 Snoring: Secondary | ICD-10-CM

## 2020-01-03 DIAGNOSIS — K219 Gastro-esophageal reflux disease without esophagitis: Secondary | ICD-10-CM

## 2020-01-03 DIAGNOSIS — F519 Sleep disorder not due to a substance or known physiological condition, unspecified: Secondary | ICD-10-CM

## 2020-01-03 DIAGNOSIS — F17200 Nicotine dependence, unspecified, uncomplicated: Secondary | ICD-10-CM

## 2020-01-03 DIAGNOSIS — E6609 Other obesity due to excess calories: Secondary | ICD-10-CM

## 2020-01-03 DIAGNOSIS — Z6834 Body mass index (BMI) 34.0-34.9, adult: Secondary | ICD-10-CM

## 2020-01-10 NOTE — Addendum Note (Signed)
Addended by: Melvyn Novas on: 01/10/2020 05:49 PM   Modules accepted: Orders

## 2020-01-10 NOTE — Progress Notes (Signed)
Summary & Diagnosis:     Mild OSA ay AHI of 12.7/h was noted- this AHI s exacerbated in  REM sleep to 17.9/h.  Sleep apnea was not associated with hypoxemia or tachy-  bradycardia.   Recommendations:     The patient can use CPAP to treat this mild, uncomplicated apnea  but could also respond to dental device, Inspire or just weight  loss.  The immediate effect of CPAP therapy may make this a rewarding  option.  Should the patient agree, I will offer use of an autotitration  CPAP device with a setting of 5-15 cm water, 2 cm EPR, heated  humidity and mask of patient's choice.

## 2020-01-10 NOTE — Procedures (Signed)
Patient Information     First Name: [name] Last Name: [name] ID: [id number]  Birth Date: 05-Sep-1975 Age: 44 Gender:  Referring Provider: Nicki Guadalajara, MD BMI: 34.9 (W=189 lb, H=5' 2'')  Neck Circ.:  16 '' Epworth:  17  Sleep Study Information    Study Date: 01/03/20 S/H/A Version: [id number] / 4.0.1515 / 77  History:    [name] is a 44 year- old Caucasian female patient and seen here upon her MD's referral on 11/15/2019. Chief concern according to patient :  " I fall asleep in front of the TV, I have always to be stimulated or physically active to not go to sleep".  Medical history of non- seasonal vertigo.        Summary & Diagnosis:      Mild OSA ay AHI of 12.7/h was noted- this AHI s exacerbated in REM sleep to 17.9/h. Sleep apnea was not associated with hypoxemia or tachy- bradycardia.  Recommendations:      The patient can use CPAP to treat this mild, uncomplicated apnea but could also respond to dental device, Inspire or just weight loss.  The immediate effect of CPAP therapy may make this a rewarding option.  Should the patient agree, I will offer use of an autotitration CPAP device with a setting of 5-15 cm water, 2 cm EPR, heated humidity and mask of patient's choice.   Interpreting Physician: Melvyn Novas, MD            Sleep Summary  Oxygen Saturation Statistics   Start Study Time: End Study Time: Total Recording Time:  8:02:23 PM 4:30:07 AM 8 hrs,  Total Sleep Time % REM of Sleep Time:  5 hrs, 58 min 31.8    Mean: 93 Minimum: 88 Maximum: 97  Mean of Desaturations Nadirs (%):   91  Oxygen Desatur. %: 4-9 10-20 >20 Total  Events Number Total  15 100.0  0 0.0  0 0.0  15 100.0  Oxygen Saturation: <90 <=88 <85 <80 <70  Duration (minutes): Sleep % 0.2 0.0 0.1 0.0 0.0 0.0 0.0 0.0 0.0 0.0     Respiratory Indices      Total Events REM NREM All Night  pRDI:  78  pAHI:  68 ODI:  15  pAHIc:  2  % CSR: 0.0 20.1 17.9 2.2 0.0  12.9 11.1 3.1 0.5 14.8 12.9 2.9 0.4       Pulse Rate Statistics during Sleep (BPM)      Mean:  75 Minimum: 57  Maximum: 104    Indices are calculated using technically valid sleep time of  5 hrs, 15 min. pRDI/pAHI are calculated using oxi desaturations ? 3%   Body Position Statistics  Position Supine Prone Right Left Non-Supine  Sleep (min) 296.0 0.0 17.5 44.5 62.0  Sleep % 82.7 0.0 4.9 12.4 17.3  pRDI 16.5 N/A 17.2 4.2 7.3  pAHI 14.4 N/A 17.2 2.8 6.3  ODI 3.3 N/A 0.0 1.4 1.1     Snoring Statistics Snoring Level (dB) >40 >50 >60 >70 >80 >Threshold (45)  Sleep (min) 83.0 23.7 2.3 0.0 0.0 44.3  Sleep % 23.2 6.6 0.6 0.0 0.0 12.4    Mean: 42 dB Sleep Stages Chart                    pAHI=12.9  Mild              Moderate                    Severe                                                 5              15                    30  * Reference values are given by physician

## 2020-01-11 ENCOUNTER — Other Ambulatory Visit: Payer: Self-pay | Admitting: Family Medicine

## 2020-01-11 ENCOUNTER — Telehealth: Payer: Self-pay | Admitting: Neurology

## 2020-01-11 MED ORDER — MECLIZINE HCL 25 MG PO TABS: 25.0000 mg | ORAL_TABLET | Freq: Three times a day (TID) | ORAL | 0 refills | Status: DC | PRN

## 2020-01-11 NOTE — Telephone Encounter (Signed)
Called the patient and reviewed the study indicated mild sleep apnea. Advised that Dr Vickey Huger would recommend first auto CPAP. Explained that then advised alternative option could be dental device. Patient currently doesn't have insurance and is afraid this would be an out of pocket expense. Advised of [employer] used bundles that are cost effective for the patient. Informed the patient I would send the order and they would contact her and review that information. Pt advised that if she decides to pursue that option she can call and let us know so that we may schedule for a follow up visit 31-90 days from her starting the new machine.Pt verbalized understanding.

## 2020-01-11 NOTE — Telephone Encounter (Signed)
-----   Message from Melvyn Novas, MD sent at 01/10/2020  5:49 PM EDT ----- Summary & Diagnosis:     Mild OSA ay AHI of 12.7/h was noted- this AHI s exacerbated in  REM sleep to 17.9/h.  Sleep apnea was not associated with hypoxemia or tachy-  bradycardia.   Recommendations:     The patient can use CPAP to treat this mild, uncomplicated apnea  but could also respond to dental device, Inspire or just weight  loss.  The immediate effect of CPAP therapy may make this a rewarding  option.  Should the patient agree, I will offer use of an autotitration  CPAP device with a setting of 5-15 cm water, 2 cm EPR, heated  humidity and mask of patient's choice.

## 2020-02-01 NOTE — Telephone Encounter (Signed)
Received a notification from [employer] advising the patient will hold off for now.  "She has no insurance, and she would like to wait until October when she has open enrollment to see about getting insurance and make that decision then. I am going to void out this order until she calls [place] back."

## 2020-02-17 ENCOUNTER — Other Ambulatory Visit: Payer: Self-pay | Admitting: Family Medicine

## 2020-02-17 MED ORDER — MECLIZINE HCL 25 MG PO TABS: 25.0000 mg | ORAL_TABLET | Freq: Three times a day (TID) | ORAL | 0 refills | Status: DC | PRN

## 2020-02-18 ENCOUNTER — Telehealth: Payer: No Typology Code available for payment source | Admitting: Physician Assistant

## 2020-02-18 ENCOUNTER — Encounter: Payer: Self-pay | Admitting: Physician Assistant

## 2020-02-18 DIAGNOSIS — N3000 Acute cystitis without hematuria: Secondary | ICD-10-CM

## 2020-02-18 DIAGNOSIS — M549 Dorsalgia, unspecified: Secondary | ICD-10-CM

## 2020-02-18 NOTE — Progress Notes (Signed)
Based on what you shared with me, I feel your condition warrants further evaluation and I recommend that you be seen for a face to face office visit.   NOTE: If you entered your credit card information for this eVisit, you will not be charged. You may see a "hold" on your card for the $35 but that hold will drop off and you will not have a charge processed.  Mrs. [name], Acute back pain and urinary symptoms could indicate a kidney infection and your should have a face to face evaluation for further management.   If you are having a true medical emergency please call 911.      For an urgent face to face visit, [place] has five urgent care centers for your convenience:      NEW:  [facility] at [facility] Directions [phone] [address] [place], [place] [address] . 10 am - 6pm Monday - Friday    [facility]) Get Driving Directions [phone] [address] [place], [place] [address] . 10 am to 8 pm Monday-Friday . 12 pm to 8 pm [facility]gent Care at [facility] Get Driving Directions [phone] [address], [address] [place], [place] [address] . 8 am to 8 pm Monday-Friday . 9 am to 6 pm Saturday . 11 am to 6 pm Sunday     [facility] Urgent Care at [facility] Get Driving Directions  [phone] [address] [place], [place] [address] . 8 am to 8 pm Monday-Friday . 8 am to 4 pm [facility] Urgent Care at [facility] Directions [phone] [address] [place], [place] [address] . 12 pm to 6 pm Monday-Friday      Your e-visit answers were reviewed by a board certified advanced clinical practitioner to complete your personal care plan.  Thank you for using e-Visits.    I spent 5-10 minutes on review and completion of this note- Illa Level [facility]

## 2020-02-19 ENCOUNTER — Other Ambulatory Visit: Payer: Self-pay

## 2020-02-19 ENCOUNTER — Emergency Department (HOSPITAL_COMMUNITY)
Admission: EM | Admit: 2020-02-19 | Discharge: 2020-02-19 | Disposition: A | Discharge status: Home / Self Care | Payer: No Typology Code available for payment source | Attending: Emergency Medicine | Admitting: Emergency Medicine

## 2020-02-19 DIAGNOSIS — F17210 Nicotine dependence, cigarettes, uncomplicated: Secondary | ICD-10-CM | POA: Insufficient documentation

## 2020-02-19 DIAGNOSIS — M545 Low back pain: Secondary | ICD-10-CM | POA: Insufficient documentation

## 2020-02-19 DIAGNOSIS — N3001 Acute cystitis with hematuria: Secondary | ICD-10-CM | POA: Insufficient documentation

## 2020-02-19 LAB — URINALYSIS, ROUTINE W REFLEX MICROSCOPIC
Bilirubin Urine: NEGATIVE
Glucose, UA: NEGATIVE mg/dL
Ketones, ur: NEGATIVE mg/dL
Leukocytes,Ua: NEGATIVE
Nitrite: POSITIVE — AB
Protein, ur: 30 mg/dL — AB
Specific Gravity, Urine: 1.021 (ref 1.005–1.030)
pH: 5 (ref 5.0–8.0)

## 2020-02-19 LAB — PREGNANCY, URINE: Preg Test, Ur: NEGATIVE

## 2020-02-19 MED ORDER — LIDOCAINE HCL (PF) 1 % IJ SOLN
INTRAMUSCULAR | Status: AC
  Filled 2020-02-19: qty 5

## 2020-02-19 MED ORDER — CEFTRIAXONE SODIUM 1 G IJ SOLR
1.0000 g | Freq: Once | INTRAMUSCULAR | Status: AC
  Administered 2020-02-19: 1 g via INTRAMUSCULAR
  Filled 2020-02-19: qty 10

## 2020-02-19 MED ORDER — OXYCODONE-ACETAMINOPHEN 5-325 MG PO TABS
1.0000 | ORAL_TABLET | ORAL | Status: AC | PRN
  Administered 2020-02-19 (×2): 1 via ORAL
  Filled 2020-02-19 (×2): qty 1

## 2020-02-19 MED ORDER — PHENAZOPYRIDINE HCL 200 MG PO TABS: 200.0000 mg | ORAL_TABLET | Freq: Three times a day (TID) | ORAL | 0 refills | Status: DC

## 2020-02-19 MED ORDER — CEPHALEXIN 500 MG PO CAPS: 500.0000 mg | ORAL_CAPSULE | Freq: Two times a day (BID) | ORAL | 0 refills | Status: AC

## 2020-02-19 MED ORDER — NITROFURANTOIN MONOHYD MACRO 100 MG PO CAPS: 100.0000 mg | ORAL_CAPSULE | Freq: Two times a day (BID) | ORAL | 0 refills | Status: AC

## 2020-02-19 MED ORDER — PHENAZOPYRIDINE HCL 100 MG PO TABS
200.0000 mg | ORAL_TABLET | Freq: Once | ORAL | Status: AC
  Administered 2020-02-19: 200 mg via ORAL
  Filled 2020-02-19: qty 2

## 2020-02-19 NOTE — Progress Notes (Signed)
We are sorry that you are not feeling well.  Here is how we plan to help!  Based on what you shared with me it looks like you most likely have a simple urinary tract infection.  A UTI (Urinary Tract Infection) is a bacterial infection of the bladder. Please make an appointment to see someone early this week.  Most cases of urinary tract infections are simple to treat but a key part of your care is to encourage you to drink plenty of fluids and watch your symptoms carefully.  I have prescribed MacroBid 100 mg twice a day for 5 days.  Your symptoms should gradually improve. Call us if the burning in your urine worsens, you develop worsening fever, back pain or pelvic pain or if your symptoms do not resolve after completing the antibiotic.  Urinary tract infections can be prevented by drinking plenty of water to keep your body hydrated.  Also be sure when you wipe, wipe from front to back and don't hold it in!  If possible, empty your bladder every 4 hours.  Your e-visit answers were reviewed by a board certified advanced clinical practitioner to complete your personal care plan.  Depending on the condition, your plan could have included both over the counter or prescription medications.  If there is a problem please reply  once you have received a response from your provider.  Your safety is important to [place].  If you have drug allergies check your prescription carefully.    You can use MyChart to ask questions about today's visit, request a non-urgent call back, or ask for a work or school excuse for 24 hours related to this e-Visit. If it has been greater than 24 hours you will need to follow up with your provider, or enter a new e-Visit to address those concerns.   You will get an e-mail in the next two days asking about your experience.  I hope that your e-visit has been valuable and will speed your recovery. Thank you for using e-visits.   Greater than 5 minutes, yet less than 10 minutes of  time have been spent researching, coordinating and implementing care for this patient today.

## 2020-02-19 NOTE — Addendum Note (Signed)
Addended by: Charma Igo on: 02/19/2020 11:39 AM   Modules accepted: Orders

## 2020-02-19 NOTE — ED Notes (Signed)
Urine culture sent with u/a °

## 2020-02-19 NOTE — Discharge Instructions (Addendum)
Take Keflex as prescribed and complete the full course. Take Pyridium as needed as prescribed for bladder discomfort. Be sure to drink plenty of fluids. Recheck with your doctor on Tuesday, return to ER for worsening or concerning symptoms especially fever or vomiting.

## 2020-02-19 NOTE — ED Provider Notes (Signed)
[facility] EMERGENCY DEPARTMENT Provider Note   CSN: [id number] Arrival date & time: 02/19/20  0127     History Chief Complaint  Patient presents with   Urinary Tract Infection    possible    [name] is a 44 y.o. female.  44 year old female presents with complaint of bladder pressure x1 week with frequency and urgency.  Patient had a virtual visit yesterday and was advised to seek care due to complaint of low back pain.  Patient states that 7:00 last night her symptoms became worse with water pressure worse towards the right side.  Denies nausea, vomiting, fevers, chills, loss of appetite.  No history of kidney stones.  Prior abdominal surgeries include tubal ligation.  No other complaints or concerns.        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   Loud snoring 11/15/2019   Chest pain due to GERD 11/15/2019   At risk for obstructive sleep apnea 10/22/2019   Routine screening for STI (sexually transmitted infection) 10/22/2019   BV (bacterial vaginosis) 10/22/2019   Laceration of left ankle 08/14/2018   Need for Tdap vaccination 08/14/2018   UTI symptoms 06/19/2018   Urinary tract infection 10/22/2017   Healthcare maintenance 08/12/2014   Obesity 06/08/2012   BPPV (benign paroxysmal positional vertigo) 12/10/2011   Family history of breast cancer in first degree relative 08/30/2010   Family history of colon cancer 08/30/2010   DUB (dysfunctional uterine bleeding) 08/14/2010   HYPERLIPIDEMIA 04/10/2010   TOBACCO DEPENDENCE 07/24/2006    Past Surgical History:  Procedure Laterality Date   TUBAL LIGATION       OB History    Gravida  3   Para      Term      Preterm      AB  1   Living  2     SAB      TAB  1   Ectopic      Multiple      Live Births  2            Family History  Problem Relation Age of Onset   Cancer Mother        mother breast ca at 15 yoa, and colon ca at 2 yoa   Hypertension Mother    Hypertension Brother    Diabetes Brother     Social History   Tobacco Use   Smoking status: Current Every Day Smoker    Packs/day: 0.75    Types: Cigarettes   Smokeless tobacco: Never Used  Vaping Use   Vaping Use: Never used  Substance Use Topics   Alcohol use: No   Drug use: No    Home Medications Prior to Admission medications   Medication Sig Start Date End Date Taking? Authorizing Provider  cephALEXin (KEFLEX) 500 MG capsule Take 1 capsule (500 mg total) by mouth 2 (two) times daily for 5 days. 02/19/20 02/24/20  Jeannie Fend, PA-C  EPINEPHrine 0.3 mg/0.3 mL IJ SOAJ injection Inject 0.3 mLs (0.3 mg total) into the muscle as needed for anaphylaxis. 12/31/19   Nino Parsley, MD  meclizine (ANTIVERT) 25 MG tablet Take 1 tablet (25 mg total) by mouth 3 (three) times daily as needed for dizziness. 02/17/20   Simmons-Robinson, Tawanna Cooler, MD  phenazopyridine (PYRIDIUM) 200 MG  tablet Take 1 tablet (200 mg total) by mouth 3 (three) times daily. 02/19/20   Jeannie Fend, PA-C    Allergies    Bee venom  Review of Systems   Review of Systems  Constitutional: Negative for appetite change, chills, diaphoresis and fever.  Gastrointestinal: Negative for abdominal pain, constipation, diarrhea, nausea and vomiting.  Genitourinary: Positive for dysuria, frequency and pelvic pain. Negative for difficulty urinating, flank pain, vaginal bleeding and vaginal discharge.  Musculoskeletal: Positive for back pain.  Skin: Negative for rash and wound.  Allergic/Immunologic: Negative for immunocompromised state.  All other systems reviewed and are negative.   Physical Exam Updated Vital Signs BP (!) 131/110 (BP Location: Right Arm)    Pulse 70    Temp 98.1 F (36.7 C) (Oral)    Resp 12    Ht 5\' 1"  (1.549 m)    Wt 88.5 kg    SpO2  100%    BMI 36.84 kg/m   Physical Exam Vitals and nursing note reviewed.  Constitutional:      General: She is not in acute distress.    Appearance: She is well-developed. She is not diaphoretic.  HENT:     Head: Normocephalic and atraumatic.  Cardiovascular:     Rate and Rhythm: Normal rate and regular rhythm.     Pulses: Normal pulses.     Heart sounds: Normal heart sounds.  Pulmonary:     Effort: Pulmonary effort is normal.     Breath sounds: Normal breath sounds.  Abdominal:     Palpations: Abdomen is soft.     Tenderness: There is abdominal tenderness in the right lower quadrant and suprapubic area. There is no right CVA tenderness, left CVA tenderness, guarding or rebound.  Skin:    General: Skin is dry.     Findings: No erythema or rash.  Neurological:     Mental Status: She is alert and oriented to person, place, and time.  Psychiatric:        Behavior: Behavior normal.     ED Results / Procedures / Treatments   Labs (all labs ordered are listed, but only abnormal results are displayed) Labs Reviewed  URINALYSIS, ROUTINE W REFLEX MICROSCOPIC - Abnormal; Notable for the following components:      Result Value   Color, Urine AMBER (*)    APPearance HAZY (*)    Hgb urine dipstick SMALL (*)    Protein, ur 30 (*)    Nitrite POSITIVE (*)    Bacteria, UA RARE (*)    All other components within normal limits  URINE CULTURE  PREGNANCY, URINE    EKG None  Radiology No results found.  Procedures Procedures (including critical care time)  Medications Ordered in ED Medications  oxyCODONE-acetaminophen (PERCOCET/ROXICET) 5-325 MG per tablet 1 tablet (1 tablet Oral Given 02/19/20 0825)  cefTRIAXone (ROCEPHIN) injection 1 g (1 g Intramuscular Given 02/19/20 0825)  phenazopyridine (PYRIDIUM) tablet 200 mg (200 mg Oral Given 02/19/20 0825)  lidocaine (PF) (XYLOCAINE) 1 % injection (  Given 02/19/20 02/21/20)    ED Course  I have reviewed the triage vital signs and the  nursing notes.  Pertinent labs & imaging results that were available during my care of the patient were reviewed by me and considered in my medical decision making (see chart for details).  Clinical Course as of Feb 18 906  Sat Feb 19, 2020  0902 43yo female with bladder pressure and urinary symptoms x 1 week, now with worsening pain, more  on the right lower pelvic area. No CVA tenderness, no rebound tenderness, no vomiting or fevers. Suspect UTI, upreg negative, doubt appendicitis, pyelonephritis, ectopic pregnancy based on history of illness. She was given Pyridium and injection of Rocephin. She is feeling much better at this time. Urinalysis does show small hemoglobin, nitrite positive with protein. Sample is contaminated however due to symptomatic presentation will treat with Keflex. Also given prescription for Pyridium. Recommend she follow-up on Tuesday as already scheduled with her PCP for recheck. Given turn to ER precautions including fever, vomiting. Discussed her right low pelvic pain and possibility of appendicitis however with history of 1 week of bladder pressure and urinary symptoms suspect this is related to her UTI.   [LM]    Clinical Course User Index [LM] Alden Hipp   MDM Rules/Calculators/A&P                          Final Clinical Impression(s) / ED Diagnoses Final diagnoses:  Acute cystitis with hematuria    Rx / DC Orders ED Discharge Orders         Ordered    cephALEXin (KEFLEX) 500 MG capsule  2 times daily        02/19/20 0900    phenazopyridine (PYRIDIUM) 200 MG tablet  3 times daily        02/19/20 0900           Jeannie Fend, PA-C 02/19/20 1324    Milagros Loll, MD 02/20/20 [phone]

## 2020-02-19 NOTE — ED Triage Notes (Signed)
per pT she has been having bladder pressure x 1 week and then last night she started having severe pain in the right lower side in the pelvis area with urination issues. Pt said no blood in urine no discharge.

## 2020-02-20 LAB — URINE CULTURE

## 2020-02-22 ENCOUNTER — Ambulatory Visit: Payer: No Typology Code available for payment source

## 2020-02-25 ENCOUNTER — Other Ambulatory Visit: Payer: Self-pay

## 2020-02-25 ENCOUNTER — Encounter: Payer: Self-pay | Admitting: Family Medicine

## 2020-02-25 ENCOUNTER — Ambulatory Visit (INDEPENDENT_AMBULATORY_CARE_PROVIDER_SITE_OTHER): Payer: Self-pay | Admitting: Family Medicine

## 2020-02-25 VITALS — BP 122/70 | HR 72 | Ht 62.0 in | Wt 189.0 lb

## 2020-02-25 DIAGNOSIS — N390 Urinary tract infection, site not specified: Secondary | ICD-10-CM

## 2020-02-25 LAB — POCT URINALYSIS DIP (MANUAL ENTRY)
Bilirubin, UA: NEGATIVE
Blood, UA: NEGATIVE
Glucose, UA: NEGATIVE mg/dL
Ketones, POC UA: NEGATIVE mg/dL
Leukocytes, UA: NEGATIVE
Nitrite, UA: NEGATIVE
Protein Ur, POC: NEGATIVE mg/dL
Spec Grav, UA: 1.02 (ref 1.010–1.025)
Urobilinogen, UA: 0.2 E.U./dL
pH, UA: 6 (ref 5.0–8.0)

## 2020-02-25 MED ORDER — FLUCONAZOLE 150 MG PO TABS: 150.0000 mg | ORAL_TABLET | Freq: Once | ORAL | 0 refills | Status: AC

## 2020-02-25 MED ORDER — MECLIZINE HCL 25 MG PO TABS: 25.0000 mg | ORAL_TABLET | Freq: Three times a day (TID) | ORAL | 3 refills | Status: DC | PRN

## 2020-02-25 NOTE — Progress Notes (Signed)
    SUBJECTIVE:   CHIEF COMPLAINT / HPI: hosp f/u for UTI   Yeast infection symptoms following Abx  Patient was recently diagnosed with UTI and given CTX in ED on 9/15 followed by course of Keflex. She presents today having completed her antibiotic course. She reports feeling much better since presenting to the ED. She denies fevers, dysuria, abdominal pressure or back pain. She states that she has been sexually active with a  New partner but reports using barrier contraception. She states that since finishing the abx, she has noticed minimal vaginal pruritis, denies vaginal discharge.   Patient is also requesting refill for meclizine.   PERTINENT  PMH / PSH:  UTI  OBJECTIVE:   BP 122/70   Pulse 72   Ht 5\' 2"  (1.575 m)   Wt 189 lb (85.7 kg)   SpO2 99%   BMI 34.57 kg/m    General: female appearing stated age in no acute distress Cardio: Normal S1 and S2, no S3 or S4. Rhythm is regular Pulm: Clear to auscultation bilaterally, no crackles, wheezing, or diminished breath sounds Abdomen: Bowel sounds normal. Abdomen soft and non-tender.   ASSESSMENT/PLAN:   UTI (urinary tract infection) U/A normal without signs of lingering infection  Vaginal pruritis in setting of vaginal candidiasis following abx treatment course  - diflucan prescribed   , MD [facility]

## 2020-02-25 NOTE — Patient Instructions (Signed)
It was a pleasure to see you today!  Thank you for choosing [facility] for your primary care.  [name] was seen for hospital follow up.   Our plans for today were:  Your urine test looks normal and without signs of infection.   Please continue to stay hydrated and drinking water.   Please return if you notice burning with urination, back pain in the area of your kidneys, fever or chills.   For you vaginal itching, I have prescribed diflucan as you develop yeast infections after antibiotic treatments.   To keep you healthy, please keep in mind the following health maintenance items that you are due for:   1. Colonoscopy.    You should return to our clinic as needed.   Best Wishes,   Dr. Neita Garnet

## 2020-02-28 NOTE — Assessment & Plan Note (Signed)
U/A normal without signs of lingering infection  Vaginal pruritis in setting of vaginal candidiasis following abx treatment course  - diflucan prescribed

## 2020-04-14 ENCOUNTER — Ambulatory Visit (INDEPENDENT_AMBULATORY_CARE_PROVIDER_SITE_OTHER): Payer: Self-pay | Admitting: Family Medicine

## 2020-04-14 ENCOUNTER — Other Ambulatory Visit: Payer: Self-pay | Admitting: Family Medicine

## 2020-04-14 ENCOUNTER — Other Ambulatory Visit (HOSPITAL_COMMUNITY)
Admission: RE | Admit: 2020-04-14 | Discharge: 2020-04-14 | Disposition: A | Discharge status: Home / Self Care | Payer: No Typology Code available for payment source | Source: Ambulatory Visit | Attending: Family Medicine | Admitting: Family Medicine

## 2020-04-14 ENCOUNTER — Other Ambulatory Visit: Payer: Self-pay

## 2020-04-14 VITALS — BP 130/90 | HR 89 | Ht 62.0 in | Wt 190.1 lb

## 2020-04-14 DIAGNOSIS — B3731 Acute candidiasis of vulva and vagina: Secondary | ICD-10-CM

## 2020-04-14 DIAGNOSIS — Z1159 Encounter for screening for other viral diseases: Secondary | ICD-10-CM

## 2020-04-14 DIAGNOSIS — Z0000 Encounter for general adult medical examination without abnormal findings: Secondary | ICD-10-CM

## 2020-04-14 DIAGNOSIS — Z113 Encounter for screening for infections with a predominantly sexual mode of transmission: Secondary | ICD-10-CM | POA: Insufficient documentation

## 2020-04-14 DIAGNOSIS — B373 Candidiasis of vulva and vagina: Secondary | ICD-10-CM

## 2020-04-14 DIAGNOSIS — N898 Other specified noninflammatory disorders of vagina: Secondary | ICD-10-CM

## 2020-04-14 DIAGNOSIS — Z114 Encounter for screening for human immunodeficiency virus [HIV]: Secondary | ICD-10-CM

## 2020-04-14 LAB — POCT WET PREP (WET MOUNT)
Clue Cells Wet Prep Whiff POC: NEGATIVE
Trichomonas Wet Prep HPF POC: ABSENT

## 2020-04-14 MED ORDER — FLUCONAZOLE 150 MG PO TABS: 150.0000 mg | ORAL_TABLET | Freq: Once | ORAL | 0 refills | Status: AC

## 2020-04-14 NOTE — Progress Notes (Signed)
    SUBJECTIVE:   CHIEF COMPLAINT / HPI: bump, vaginal d/c  Vaginal Discharge: Patient is a 44 y.o. female presenting with vaginal discharge for 2 days and some itching.  She states the discharge is of thin, clear consistency.  She does not note any vaginal odor.  She is interested in screening for sexually transmitted infections today. She noticed a small white bump on her outer labia about a week ago. She recently got back together with her husband. She had chlamydia at 49 yo, none since then. LMP: 03/23/20, regular periods about every 26-30 days. S/p BTL. Patient reports she shaves her hair.  PERTINENT  PMH / PSH: None relevant  OBJECTIVE:   BP 130/90   Pulse 89   Ht 5\' 2"  (1.575 m)   Wt 190 lb 2 oz (86.2 kg)   SpO2 99%   BMI 34.77 kg/m    General: NAD, pleasant, able to participate in exam Respiratory: Normal effort, no obvious respiratory distress Pelvic: VULVA: normal appearing vulva with no masses or tenderness, one small 68mm pustule at 8 o'clock where labia minora meets labia majora, VAGINA: Normal appearing vagina with normal color, with scant, white and thin discharge present, CERVIX: No lesions, no discharge present. Physical Exam Vitals and nursing note reviewed. Exam conducted with a chaperone present.  Genitourinary:    Exam position: Lithotomy position.        ASSESSMENT/PLAN:   Vaginal candidiasis 44 y.o. female with vaginal discharge, itching for 2 days.  Physical exam significant for thin, white discharge, as well as a small pustule likely due to folliculitis.  Wet prep performed today shows yeast confirmed by KOH consistent with vaginal candidiasis.  Patient is interested in STI screening.   Plan: -Wet prep as above.  Will treat with fluconazole. -GC/chlamydia pending -Will check HIV and RPR -Recommend taking a break from shaving pubis and using warm compress for folliculitis  Healthcare maintenance Patient is getting first COVID-19 shot today, repeat  in 21 days. Recommended tylenol for soreness, mild fever.    56, MD [facility]

## 2020-04-14 NOTE — Patient Instructions (Addendum)
It was a pleasure to see you today!  1. We will get some labs today.  If they are abnormal or we need to do something about them, I will call you.  If they are normal, I will send you a message on MyChart (if it is active) or a letter in the mail.  If you don't hear from [place] in 2 weeks, please call the office  [phone].  2. For your Covid shot, get your next shot in 21 days. Use acetaminophen if sore, or have a fever.   3. I recommend using a warm compress for the area of concern and it will resolve soon. Please follow up if it doesn't get better or looks infected (red, swollen, painful, drains green/yellow pus).  Be Well,  Dr. Leary Roca

## 2020-04-14 NOTE — Assessment & Plan Note (Signed)
Patient is getting first COVID-19 shot today, repeat in 21 days. Recommended tylenol for soreness, mild fever.

## 2020-04-14 NOTE — Assessment & Plan Note (Signed)
44 y.o. female with vaginal discharge, itching for 2 days.  Physical exam significant for thin, white discharge, as well as a small pustule likely due to folliculitis.  Wet prep performed today shows yeast confirmed by KOH consistent with vaginal candidiasis.  Patient is interested in STI screening.   Plan: -Wet prep as above.  Will treat with fluconazole. -GC/chlamydia pending -Will check HIV and RPR -Recommend taking a break from shaving pubis and using warm compress for folliculitis

## 2020-04-15 LAB — RPR: RPR Ser Ql: NONREACTIVE

## 2020-04-15 LAB — HCV INTERPRETATION

## 2020-04-15 LAB — HCV AB W REFLEX TO QUANT PCR: HCV Ab: <0.1 s/co ratio (ref 0.0–0.9)

## 2020-04-15 LAB — HIV ANTIBODY (ROUTINE TESTING W REFLEX): HIV Screen 4th Generation wRfx: NONREACTIVE

## 2020-04-17 LAB — CERVICOVAGINAL ANCILLARY ONLY
Chlamydia: NEGATIVE
Comment: NEGATIVE
Comment: NORMAL
Neisseria Gonorrhea: NEGATIVE

## 2020-04-24 ENCOUNTER — Telehealth: Payer: Self-pay | Admitting: Family Medicine

## 2020-04-24 NOTE — Telephone Encounter (Signed)
Called and discussed negative results with patient. She has not checked on pimple as she has been on her menstrual cycle, but will check again. She got the diflucan, no yeast symptoms.  Shirlean Mylar, MD [facility], PGY-2

## 2020-05-05 ENCOUNTER — Ambulatory Visit: Payer: No Typology Code available for payment source

## 2020-05-05 ENCOUNTER — Encounter: Payer: Self-pay | Admitting: Family Medicine

## 2020-05-05 ENCOUNTER — Other Ambulatory Visit: Payer: Self-pay

## 2020-05-18 ENCOUNTER — Other Ambulatory Visit: Payer: Self-pay | Admitting: Family Medicine

## 2020-05-18 ENCOUNTER — Encounter: Payer: Self-pay | Admitting: Family Medicine

## 2020-05-18 MED ORDER — FLUTICASONE PROPIONATE 50 MCG/ACT NA SUSP: 2.0000 | Freq: Every day | NASAL | 6 refills | Status: DC

## 2020-05-18 MED ORDER — CETIRIZINE HCL 10 MG PO TABS: 10.0000 mg | ORAL_TABLET | Freq: Every day | ORAL | 11 refills | Status: DC

## 2020-05-18 NOTE — Progress Notes (Signed)
Called in Flonase and cetirizine for patient to help with tympanic membrane effusion.

## 2020-06-02 ENCOUNTER — Telehealth: Payer: Self-pay

## 2020-06-02 ENCOUNTER — Ambulatory Visit: Payer: No Typology Code available for payment source

## 2020-06-02 NOTE — Telephone Encounter (Signed)
Patient calls nurse line stating she has a nurse apt this afternoon for 2nd Covid Vaccine. However, patient reports continued ear pain from her "cold." Patient reports the medications given to her over mychart are not helping. Patient advised with continued symptoms of "cold" I will need to reach out to PCP to see if she should reschedule her apt this afternoon and be rescheduled for [facility] at next available time. Please advise.

## 2020-06-03 ENCOUNTER — Encounter: Payer: Self-pay | Admitting: Family Medicine

## 2020-06-03 NOTE — Telephone Encounter (Addendum)
Recommend that patient be evaluated in [facility] clinic and wait until cold symptoms have resolved prior to receiving additional vaccine dosing. MyChart message sent to patient with recommendation to schedule for [facility] clinic and hold off on vaccination.

## 2020-06-07 ENCOUNTER — Other Ambulatory Visit: Payer: Self-pay

## 2020-06-07 ENCOUNTER — Ambulatory Visit (INDEPENDENT_AMBULATORY_CARE_PROVIDER_SITE_OTHER): Payer: Self-pay | Admitting: Family Medicine

## 2020-06-07 VITALS — BP 122/84 | HR 84 | Temp 98.9°F | Wt 190.0 lb

## 2020-06-07 DIAGNOSIS — H938X1 Other specified disorders of right ear: Secondary | ICD-10-CM | POA: Insufficient documentation

## 2020-06-07 NOTE — Progress Notes (Signed)
    SUBJECTIVE:   CHIEF COMPLAINT / HPI:   Ear Fullness Started after getting some congestion and losing her voice December 6th No fevers Was first in the left ear, now in the right Feels like fluid is in her ear When leans forward the fullness worsens, gets better when leans backwards Still has some congestion Used flonase 1 week and didn't help, stopped it because it made her throat numb Also tried zyrtec without improvement Otherwise feeling well No difficulty breathing  PERTINENT  PMH / PSH: BPPV, HLD, tobacco dependence obesity  OBJECTIVE:   BP 122/84   Pulse 84   Temp 98.9 F (37.2 C) (Oral)   Wt 190 lb (86.2 kg)   LMP 06/06/2020   SpO2 99%   BMI 34.75 kg/m    Physical Exam:  General: 45 y.o. female in NAD HEENT: b/l nares clear, b/l TMs clear, no effusion noted, throat clear and non-erythematous, no TTP sinuses, does not feel release of right ear with valsalva Lungs: Breathing comfortably on RA Skin: warm and dry Extremities: Ambulating without difficulty   ASSESSMENT/PLAN:   Ear fullness, right Reports that she has not complete loss of hearing.  Most likely eustachian tube dysfunction of right ear.  No abnormalities noted on exam.  No signs of sinus infection at this time.  Stressed proper flonase administration and advised continuation of use, incorporating frequent valsalva to improve ET drainage.  Will go ahead and refer to ENT as well for further evaluation as patient does not think flonase will help her and wants something else done.  No indication for antibiotics at this time.  F/u PCP in 1 month if no improvement or hasn't seen ENT yet.      Unknown Jim, DO [facility]

## 2020-06-07 NOTE — Assessment & Plan Note (Signed)
Reports that she has not complete loss of hearing.  Most likely eustachian tube dysfunction of right ear.  No abnormalities noted on exam.  No signs of sinus infection at this time.  Stressed proper flonase administration and advised continuation of use, incorporating frequent valsalva to improve ET drainage.  Will go ahead and refer to ENT as well for further evaluation as patient does not think flonase will help her and wants something else done.  No indication for antibiotics at this time.  F/u PCP in 1 month if no improvement or hasn't seen ENT yet.

## 2020-06-07 NOTE — Patient Instructions (Signed)
Thank you for coming to see me today. It was a pleasure. Today we talked about:   Continue flonase, aim towards your ear.  Do the nose holding exercises as many times a day as you can.  I have placed a referral to ENT for your ear fullness.  If you do not hear from them in the next 2 weeks, please give [place] a call.  Please follow-up with PCP in 1 month.  If you have any questions or concerns, please do not hesitate to call the office at [phone].  Best,   Luis Abed, DO

## 2020-06-23 ENCOUNTER — Telehealth: Payer: No Typology Code available for payment source | Admitting: Nurse Practitioner

## 2020-06-23 DIAGNOSIS — J029 Acute pharyngitis, unspecified: Secondary | ICD-10-CM

## 2020-06-23 MED ORDER — AMOXICILLIN 500 MG PO CAPS: 500.0000 mg | ORAL_CAPSULE | Freq: Two times a day (BID) | ORAL | 0 refills | Status: DC

## 2020-06-23 NOTE — Progress Notes (Signed)

## 2020-07-04 ENCOUNTER — Encounter (INDEPENDENT_AMBULATORY_CARE_PROVIDER_SITE_OTHER): Payer: Self-pay | Admitting: Otolaryngology

## 2020-07-04 ENCOUNTER — Ambulatory Visit (INDEPENDENT_AMBULATORY_CARE_PROVIDER_SITE_OTHER): Payer: Self-pay | Admitting: Otolaryngology

## 2020-07-04 ENCOUNTER — Other Ambulatory Visit: Payer: Self-pay

## 2020-07-04 VITALS — Temp 97.9°F

## 2020-07-04 DIAGNOSIS — K219 Gastro-esophageal reflux disease without esophagitis: Secondary | ICD-10-CM

## 2020-07-04 DIAGNOSIS — H6983 Other specified disorders of Eustachian tube, bilateral: Secondary | ICD-10-CM

## 2020-07-04 DIAGNOSIS — R490 Dysphonia: Secondary | ICD-10-CM

## 2020-07-04 NOTE — Progress Notes (Signed)
HPI: [name] is a 45 y.o. female who presents is referred by her PCP Dr. Leveda Anna for evaluation of ear and throat complaints.  She stated that this started back in December a couple of weeks after she received the Covid vaccine shot.  She developed cold type symptoms with a cough, throat discomfort and ear congestion.  She initially developed a muffled hearing in both ears..  She also lost her voice and had difficulty swallowing with the complaints of a dry throat.  Her ear is presently doing much better but she has had some popping in the left ear.  Her cough likewise is doing much better.  She was treated with amoxicillin following an ED visit several weeks ago.  Still has slight hoarseness. She was also having some reflux type symptoms and recently started taking Prilosec in the mornings and this is doing much better.  Past Medical History:  Diagnosis Date  . Vertigo   . Vertigo    Past Surgical History:  Procedure Laterality Date  . TUBAL LIGATION     Social History   Socioeconomic History  . Marital status: Single    Spouse name: Not on file  . Number of children: 2  . Years of education: Not on file  . Highest education level: Bachelor's degree (e.g., BA, AB, BS)  Occupational History  . Not on file  Tobacco Use  . Smoking status: Current Every Day Smoker    Packs/day: 0.75    Types: Cigarettes  . Smokeless tobacco: Never Used  Vaping Use  . Vaping Use: Never used  Substance and Sexual Activity  . Alcohol use: No  . Drug use: No  . Sexual activity: Yes    Birth control/protection: Surgical  Other Topics Concern  . Not on file  Social History Narrative  . Not on file   Social Determinants of Health   Financial Resource Strain: Not on file  Food Insecurity: Not on file  Transportation Needs: Not on file  Physical Activity: Not on file  Stress: Not on file  Social Connections: Not on file   Family History  Problem Relation Age of Onset  . Cancer Mother         mother breast ca at 48 yoa, and colon ca at 65 yoa  . Hypertension Mother   . Hypertension Brother   . Diabetes Brother    Allergies  Allergen Reactions  . Bee Venom Anaphylaxis   Prior to Admission medications   Medication Sig Start Date End Date Taking? Authorizing Provider  amoxicillin (AMOXIL) 500 MG capsule Take 1 capsule (500 mg total) by mouth 2 (two) times daily. 06/23/20   Daphine Deutscher Mary-Margaret, FNP  cetirizine (ZYRTEC) 10 MG tablet Take 1 tablet (10 mg total) by mouth daily. 05/18/20   Simmons-Robinson, Makiera, MD  EPINEPHrine 0.3 mg/0.3 mL IJ SOAJ injection Inject 0.3 mLs (0.3 mg total) into the muscle as needed for anaphylaxis. 12/31/19   Nino Parsley, MD  fluticasone (FLONASE) 50 MCG/ACT nasal spray Place 2 sprays into both nostrils daily. 05/18/20   Simmons-Robinson, Tawanna Cooler, MD  meclizine (ANTIVERT) 25 MG tablet Take 1 tablet (25 mg total) by mouth 3 (three) times daily as needed for dizziness. 02/25/20   Simmons-Robinson, Tawanna Cooler, MD  phenazopyridine (PYRIDIUM) 200 MG tablet Take 1 tablet (200 mg total) by mouth 3 (three) times daily. 02/19/20   Jeannie Fend, PA-C     Positive ROS: Otherwise negative  All other systems have been reviewed and were otherwise negative  with the exception of those mentioned in the HPI and as above.  Physical Exam: Constitutional: Alert, well-appearing, no acute distress Ears: External ears without lesions or tenderness. Ear canals are clear bilaterally.  TMs are clear bilaterally with good mobility pneumatic otoscopy.  I was able to insufflate some air behind both TMs without any difficulty.  On hearing screening with the 512 1024 tuning fork she heard well in both ears with AC > BC bilaterally. Nasal: External nose without lesions. Septum relatively midline with mild rhinitis and clear mucus discharge.  Both middle meatus regions were clear with no signs of active infection or mucopurulent discharge noted.  The nasopharynx was clear and the  eustachian tube areas were unobstructed..  Oral: Lips and gums without lesions. Tongue and palate mucosa without lesions. Posterior oropharynx clear.  Tonsils are small and benign-appearing bilaterally no exudate noted. Fiberoptic laryngoscopy was performed through the right nostril.  The nasopharynx was clear.  Middle meatus regions were clear bilaterally.  The base of tongue vallecula and epiglottis were normal.  Vocal cords had symmetric mobility.  Has some minimal edema or perhaps mild laryngitis but no vocal cord lesions noted.  Have mild arytenoid edema consistent with probable reflux symptoms. Neck: No palpable adenopathy or masses. Respiratory: Breathing comfortably  Skin: No facial/neck lesions or rash noted.  Laryngoscopy  Date/Time: 07/04/2020 5:04 PM Performed by: Drema Halon, MD Authorized by: Drema Halon, MD   Consent:    Consent obtained:  Verbal   Consent given by:  Patient Procedure details:    Indications: hoarseness, dysphagia, or aspiration     Medication:  Afrin   Instrument: flexible fiberoptic laryngoscope     Scope location: bilateral nare   Sinus:    Right middle meatus: normal     Left middle meatus: normal     Right nasopharynx: normal     Left nasopharynx: normal   Mouth:    Oropharynx: normal     Vallecula: normal     Base of tongue: normal     Epiglottis: normal   Throat:    True vocal cords: normal   Comments:     Vocal cords had minimal inflammatory changes consistent with probable mild or resolving laryngitis.  No specific vocal cord lesions noted.  Both cords had symmetric mobility.  She also had mild edema of the arytenoid mucosa consistent with probable reflux symptoms.    Assessment: Suspect patient had a upper respiratory infection that went to a secondary bronchitis causing the chronic cough.  May have also had otitis media although this is resolved presently with no middle ear fluid noted on otoscopic exam. She also  reports a history of heartburn and reflux type symptoms that is improved on Prilosec. Hoarseness likewise is doing better.  Plan: Overall she seems to be doing much better.  I discussed with her that she has no middle ear fluid but probably has some mild eustachian tube dysfunction and will continue with use of nasal steroid spray and suggested using Nasacort or Flonase 2 sprays each nostril at night. She complains of occasional sore throat when her throat grayish dry recommended drinking plenty of liquids and perhaps sucking on some throat lozenges or candy that will help soothe the throat. She will follow-up if she notices any worsening of her voice or worsening of her hearing. Recommended continuation with the Prilosec OTC for another 4 weeks either first thing in the morning or before dinner.   Narda Bonds, MD  CC:

## 2020-07-06 ENCOUNTER — Encounter: Payer: Self-pay | Admitting: Family Medicine

## 2020-07-17 ENCOUNTER — Other Ambulatory Visit: Payer: Self-pay | Admitting: Family Medicine

## 2020-07-31 ENCOUNTER — Other Ambulatory Visit: Payer: Self-pay

## 2020-07-31 ENCOUNTER — Ambulatory Visit (INDEPENDENT_AMBULATORY_CARE_PROVIDER_SITE_OTHER): Payer: Self-pay | Admitting: Student in an Organized Health Care Education/Training Program

## 2020-07-31 DIAGNOSIS — R053 Chronic cough: Secondary | ICD-10-CM

## 2020-07-31 MED ORDER — GABAPENTIN 300 MG PO CAPS: 300.0000 mg | ORAL_CAPSULE | Freq: Every day | ORAL | 0 refills | Status: DC

## 2020-07-31 MED ORDER — BACLOFEN 10 MG PO TABS: 10.0000 mg | ORAL_TABLET | Freq: Every evening | ORAL | 0 refills | Status: DC | PRN

## 2020-07-31 NOTE — Patient Instructions (Signed)
It was a pleasure to see you today!  To summarize our discussion for this visit:  For your throat discomfort/cough- we do not have a definitive cause yet. Some likely or common causes are post-nasal drainage, neurogenic cough after a viral illness, smoking, or acid reflux. After our discussion, we are going to try first to treat for neurogenic cough with these measures:  Gabapentin nightly  Muscle relaxer nightly  Warm liquids without stimulants  Honey  Discontinue smoking if possible  If not improved in 2 weeks, please see ENT for follow up  You can also try sleeping elevated, decreasing spicy diet and taking antacids, and treating your sinus congestion for further improvement of the other common causes.   Some additional health maintenance measures we should update are: Health Maintenance Due  Topic Date Due  . COLONOSCOPY (Pts 45-69yrs Insurance coverage will need to be confirmed)  08/30/2015  . COVID-19 Vaccine (2 - Pfizer 3-dose series) 05/05/2020  .    Call the clinic at [phone] if your symptoms worsen or you have any concerns.   Thank you for allowing me to take part in your care,  Dr. Jamelle Rushing

## 2020-07-31 NOTE — Progress Notes (Signed)
SUBJECTIVE:   CHIEF COMPLAINT / HPI: sore throat  Patient originally developed sore throat symptoms with cough on December 6.  She has been seen multiple times for this problem including with PCP, E-visit with outside provider and ENT. From the E-visit she was diagnosed with strep throat and prescribed amoxicillin but denies improvement in her symptoms. At ENT visit she received nasopharynx scope where they observed her vocal cords and she states that they do not see any abnormalities there.  They recommended she gargle salt water and use Flonase for congestion which she complied with for couple of weeks but discontinued when she did not see a benefit. Today, she returns to be seen due to her continued symptoms. New raspy voice since January Smokes less than a pack per day (recently trying to cut back)- since she was 17. Sometimes feels that her throat is swollen and difficulty with breathing.  No problems with swallowing. Sometimes feels like there is a lump in her throat. No other systemic problems. Cough has been present since December and worse at night time. Mouth does not feel dry.  OBJECTIVE:   BP 118/75   Pulse 95   Temp 98.7 F (37.1 C)   SpO2 99%   Physical Exam Vitals and nursing note reviewed.  Constitutional:      General: She is not in acute distress.    Appearance: She is obese. She is not ill-appearing, toxic-appearing or diaphoretic.  HENT:     Head: Normocephalic and atraumatic.     Jaw: No tenderness or pain on movement.     Salivary Glands: Right salivary gland is not diffusely enlarged or tender. Left salivary gland is not diffusely enlarged or tender.     Comments: Positive for crepitus at TMJ    Right Ear: Tympanic membrane normal.     Left Ear: Tympanic membrane normal.     Nose: Nose normal. No congestion or rhinorrhea.     Right Nostril: No occlusion.     Left Nostril: No occlusion.     Right Sinus: No maxillary sinus tenderness or frontal sinus  tenderness.     Left Sinus: No maxillary sinus tenderness or frontal sinus tenderness.     Mouth/Throat:     Lips: No lesions.     Mouth: Mucous membranes are moist. No lacerations, oral lesions or angioedema.     Dentition: Abnormal dentition. Has dentures. No dental abscesses.     Tongue: Lesions (yellow discoloration (likely from smoking)) present.     Pharynx: Oropharynx is clear. Uvula midline. No pharyngeal swelling, oropharyngeal exudate, posterior oropharyngeal erythema or uvula swelling.     Tonsils: No tonsillar exudate or tonsillar abscesses.  Eyes:     General:        Right eye: No discharge.        Left eye: No discharge.  Skin:    General: Skin is warm.     Capillary Refill: Capillary refill takes less than 2 seconds.     Findings: Erythema (Appears to have sun burn) present.  Neurological:     Mental Status: She is alert.    ASSESSMENT/PLAN:   Chronic cough History of cough since May 01, 2020 Patient was not tested for strep throat but was treated with amoxicillin by previous telemedicine visit with outside provider She was seen by ENT who used fiberoptic camera to evaluate her vocal cords and oropharynx and per patient, did not see any lesions On my exam today, patient has no infectious signs  present and did not have cough throughout exam Diagnosis of this chronic cough is not concrete at this time but highest on my differential would be a neurogenic cough following likely initial viral infection when cough started in December.  Also my differential I would maintain the possibility of continued irritation from postnasal drip, allergies, GERD, malignancy. -Discussed these possibilities with the patient and given her desperation to resolve her symptoms, she would like to go ahead with a therapy today to treat the most likely cause to try to get some relief.  -Trial of gabapentin, muscle relaxer nightly for 2 weeks in combination with honey, warm drinks or other to help  soothe her throat and return if not improved with this treatment. -Also recommend continued treatment of sinus congestion, allergies, GERD if not improving to rule out other causes -ENT did not see any abnormal lesions with fiberoptic evaluation which makes a malignant lesion very unlikely however, patient is a smoker and this increases her risk of having a malignant cause.  If she is not getting better with conservative treatments, recommend she return back to ENT for further evaluation.     Leeroy Bock, DO [facility]

## 2020-08-01 DIAGNOSIS — R053 Chronic cough: Secondary | ICD-10-CM | POA: Insufficient documentation

## 2020-08-01 NOTE — Assessment & Plan Note (Signed)
History of cough since May 01, 2020 Patient was not tested for strep throat but was treated with amoxicillin by previous telemedicine visit with outside provider She was seen by ENT who used fiberoptic camera to evaluate her vocal cords and oropharynx and per patient, did not see any lesions On my exam today, patient has no infectious signs present and did not have cough throughout exam Diagnosis of this chronic cough is not concrete at this time but highest on my differential would be a neurogenic cough following likely initial viral infection when cough started in December.  Also my differential I would maintain the possibility of continued irritation from postnasal drip, allergies, GERD, malignancy. -Discussed these possibilities with the patient and given her desperation to resolve her symptoms, she would like to go ahead with a therapy today to treat the most likely cause to try to get some relief.  -Trial of gabapentin, muscle relaxer nightly for 2 weeks in combination with honey, warm drinks or other to help soothe her throat and return if not improved with this treatment. -Also recommend continued treatment of sinus congestion, allergies, GERD if not improving to rule out other causes -ENT did not see any abnormal lesions with fiberoptic evaluation which makes a malignant lesion very unlikely however, patient is a smoker and this increases her risk of having a malignant cause.  If she is not getting better with conservative treatments, recommend she return back to ENT for further evaluation.

## 2020-08-07 ENCOUNTER — Other Ambulatory Visit: Payer: Self-pay | Admitting: Student in an Organized Health Care Education/Training Program

## 2020-10-17 ENCOUNTER — Ambulatory Visit (INDEPENDENT_AMBULATORY_CARE_PROVIDER_SITE_OTHER): Payer: Self-pay | Admitting: Family Medicine

## 2020-10-17 ENCOUNTER — Encounter: Payer: Self-pay | Admitting: Family Medicine

## 2020-10-17 ENCOUNTER — Other Ambulatory Visit: Payer: Self-pay

## 2020-10-17 VITALS — BP 106/84 | HR 91 | Ht 62.0 in | Wt 199.0 lb

## 2020-10-17 DIAGNOSIS — M25562 Pain in left knee: Secondary | ICD-10-CM

## 2020-10-17 DIAGNOSIS — G8929 Other chronic pain: Secondary | ICD-10-CM

## 2020-10-17 DIAGNOSIS — M7989 Other specified soft tissue disorders: Secondary | ICD-10-CM

## 2020-10-17 NOTE — Progress Notes (Deleted)
    SUBJECTIVE:   CHIEF COMPLAINT / HPI:   Knee down swelling: no hx of heart or kidney disease  PERTINENT  PMH / PSH: ***  OBJECTIVE:   There were no vitals taken for this visit.  ***  ASSESSMENT/PLAN:   No problem-specific Assessment & Plan notes found for this encounter.     Sandre Kitty, MD [facility]   {    This will disappear when note is signed, click to select method of visit    :1}

## 2020-10-17 NOTE — Patient Instructions (Signed)
It was nice to see you today,  For your leg swelling I have ordered some blood test to rule out cardiac, liver and kidney causes.  If those are ruled out we can attribute it to venous incompetence.  For your knee pain it is most likely arthritis.  I would like to get x-rays of your knee.  You can go at your earliest convenience to the [facility] imaging location on [address].  Either one.  For pain relief, I would try taking Tylenol (you can take 1000 mg every 8 hours as needed).  You can also use Voltaren gel 4 times a day on your knee.  Both of these are available over-the-counter.  Have a great day,  Frederic Jericho, MD

## 2020-10-17 NOTE — Progress Notes (Signed)
    SUBJECTIVE:   CHIEF COMPLAINT / HPI:   Leg swelling: this is a new problem. started Wednesday of last week on the ankles.  Thursday she noticed some tingling /pins and needles of the feet. She also noticed the left ankle was 'really big' and the right was slightly enlarged. It has improved since that time.  No dypsnea on exertion.  No chest pain.  No personal or family hx of kidney or heart disease.  No alcohol use.    Left Knee pain: chronic issue that recently worsened. yesterday she did yard work and had pain in knee and hips. Woke up and her knee pain was worse.  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  Has no history of knee trauma.  Has never been diagnosed with arthritis.  She doesn't take any medications for the knee pain.  States 'those things don't work for me'.     Only medication she is taking is meclizine.    PERTINENT  PMH / PSH:   OBJECTIVE:   BP 106/84   Pulse 91   Ht 5\' 2"  (1.575 m)   Wt 199 lb (90.3 kg)   LMP 10/15/2020   SpO2 99%   BMI 36.40 kg/m   Gen: alert, oriented.  No acute distress.  Cv: rrr, no murmurs Pulm: lctab. No wheezes or crackles.  Msk: no lower extremity swelling noted.  Symmetric appearing calves.  No left knee crepitus.  No knee swelling or deformity noted.    ASSESSMENT/PLAN:   Leg swelling No swelling noted today. Appears to be venous incompetence.  Will get cmp, cbc to rule out other causes.  No concern for heart failure based on exam and history.  No concern for dvt given the b/l nature.    Chronic pain of left knee Likely undiagnosed osteoarthritis.  Will get x rays to further evaluate.  Discussed tylenol and voltaren gel.  Her job is already considered 'light duty' so unable to decrease msk stress at work.       10/17/2020, MD [facility]

## 2020-10-18 DIAGNOSIS — G8929 Other chronic pain: Secondary | ICD-10-CM | POA: Insufficient documentation

## 2020-10-18 DIAGNOSIS — M7989 Other specified soft tissue disorders: Secondary | ICD-10-CM | POA: Insufficient documentation

## 2020-10-18 LAB — COMPREHENSIVE METABOLIC PANEL
ALT: 9 IU/L (ref 0–32)
AST: 12 IU/L (ref 0–40)
Albumin/Globulin Ratio: 1.8 (ref 1.2–2.2)
Albumin: 4.1 g/dL (ref 3.8–4.8)
Alkaline Phosphatase: 63 IU/L (ref 44–121)
BUN/Creatinine Ratio: 8 — ABNORMAL LOW (ref 9–23)
BUN: 8 mg/dL (ref 6–24)
Bilirubin Total: 0.3 mg/dL (ref 0.0–1.2)
CO2: 19 mmol/L — ABNORMAL LOW (ref 20–29)
Calcium: 9.2 mg/dL (ref 8.7–10.2)
Chloride: 106 mmol/L (ref 96–106)
Creatinine, Ser: 1.02 mg/dL — ABNORMAL HIGH (ref 0.57–1.00)
Globulin, Total: 2.3 g/dL (ref 1.5–4.5)
Glucose: 110 mg/dL — ABNORMAL HIGH (ref 65–99)
Potassium: 3.9 mmol/L (ref 3.5–5.2)
Sodium: 140 mmol/L (ref 134–144)
Total Protein: 6.4 g/dL (ref 6.0–8.5)
eGFR: 70 mL/min/{1.73_m2} (ref >59)

## 2020-10-18 LAB — CBC
Hematocrit: 34.9 % (ref 34.0–46.6)
Hemoglobin: 11.5 g/dL (ref 11.1–15.9)
MCH: 27.7 pg (ref 26.6–33.0)
MCHC: 33 g/dL (ref 31.5–35.7)
MCV: 84 fL (ref 79–97)
Platelets: 368 10*3/uL (ref 150–450)
RBC: 4.15 x10E6/uL (ref 3.77–5.28)
RDW: 14.5 % (ref 11.7–15.4)
WBC: 9.3 10*3/uL (ref 3.4–10.8)

## 2020-10-18 NOTE — Assessment & Plan Note (Signed)
No swelling noted today. Appears to be venous incompetence.  Will get cmp, cbc to rule out other causes.  No concern for heart failure based on exam and history.  No concern for dvt given the b/l nature.

## 2020-10-18 NOTE — Assessment & Plan Note (Signed)
Likely undiagnosed osteoarthritis.  Will get x rays to further evaluate.  Discussed tylenol and voltaren gel.  Her job is already considered 'light duty' so unable to decrease msk stress at work.

## 2020-11-17 ENCOUNTER — Other Ambulatory Visit: Payer: Self-pay | Admitting: Family Medicine

## 2021-02-14 ENCOUNTER — Other Ambulatory Visit: Payer: Self-pay | Admitting: Family Medicine

## 2021-05-15 ENCOUNTER — Other Ambulatory Visit: Payer: Self-pay | Admitting: Family Medicine

## 2021-05-16 MED ORDER — MECLIZINE HCL 25 MG PO TABS: 25.0000 mg | ORAL_TABLET | Freq: Three times a day (TID) | ORAL | 0 refills | Status: DC | PRN

## 2021-08-05 ENCOUNTER — Encounter: Payer: Self-pay | Admitting: Family Medicine

## 2021-08-05 ENCOUNTER — Other Ambulatory Visit: Payer: Self-pay | Admitting: Family Medicine

## 2021-08-06 MED ORDER — MECLIZINE HCL 25 MG PO TABS: 25.0000 mg | ORAL_TABLET | Freq: Three times a day (TID) | ORAL | 0 refills | Status: DC | PRN

## 2021-08-06 MED ORDER — EPINEPHRINE 0.3 MG/0.3ML IJ SOAJ: 0.3000 mg | INTRAMUSCULAR | 0 refills | Status: DC | PRN

## 2021-08-20 ENCOUNTER — Other Ambulatory Visit: Payer: Self-pay

## 2021-08-20 MED ORDER — EPINEPHRINE 0.3 MG/0.3ML IJ SOAJ: 0.3000 mg | INTRAMUSCULAR | 0 refills | Status: AC | PRN

## 2021-08-20 NOTE — Telephone Encounter (Signed)
Patient calls nurse line reporting she still has not received her epi pen.  ? ?Epi pen was sent in on 3/13, however was "set to print"  ? ?Will resend to patients pharmacy.  ?

## 2021-10-30 ENCOUNTER — Encounter: Payer: Self-pay | Admitting: *Deleted

## 2021-11-09 ENCOUNTER — Other Ambulatory Visit: Payer: Self-pay | Admitting: Family Medicine

## 2021-11-19 ENCOUNTER — Ambulatory Visit (INDEPENDENT_AMBULATORY_CARE_PROVIDER_SITE_OTHER): Payer: Self-pay | Admitting: Family Medicine

## 2021-11-19 ENCOUNTER — Encounter: Payer: Self-pay | Admitting: Family Medicine

## 2021-11-19 VITALS — BP 127/91 | HR 72 | Wt 182.0 lb

## 2021-11-19 DIAGNOSIS — M79606 Pain in leg, unspecified: Secondary | ICD-10-CM | POA: Insufficient documentation

## 2021-11-19 DIAGNOSIS — G629 Polyneuropathy, unspecified: Secondary | ICD-10-CM

## 2021-11-19 LAB — POCT GLYCOSYLATED HEMOGLOBIN (HGB A1C): Hemoglobin A1C: 5.3 % (ref 4.0–5.6)

## 2021-11-19 MED ORDER — MELOXICAM 7.5 MG PO TABS: 7.5000 mg | ORAL_TABLET | Freq: Every day | ORAL | 0 refills | Status: AC

## 2021-11-19 NOTE — Assessment & Plan Note (Signed)
Pain in right lateral foot consistent with neuropathic pain  Will have check hgb A1c today  If normal A1c, will have patient go for XR to evaluate for potential avulsion fracture as point of symptoms is at location of peroneus tendon insertion/base of fifth metatarsal  Patient was agreeable with this plan  Will f/u in 2 weeks

## 2021-11-19 NOTE — Progress Notes (Signed)
    SUBJECTIVE:   CHIEF COMPLAINT / HPI: leg pain & foot pain   Denies trauma  She reports she has been having bilateral outer leg pain for years  She was seen for this before but did not have imaging done at this time  She reports feeling stiff if she sits or lies on one side for a long period of time  She reports hearing a popping noise when she walks  She reports that sitting makes her buttocks feel sore even if she sits for a  short amount of time She denies saddle anesthesia  She reports having a numbness and tingling feeling while driving two hours away to visit her brother  Denies incontinence of bladder or bowels  She denies back pain   Foot Burning sensation  Foot pain happens on the outside of her foot and feels like someone applied a lighter to the right foot  Sensation has been present for three weeks and only happens in one spot  She denies numbness on the base of her foot  Denies recent trauma to the foot    PERTINENT  PMH / PSH:  BPPV  UTI  Tobacco Use  HLD   OBJECTIVE:   BP (!) 127/91   Pulse 72   Wt 182 lb (82.6 kg)   LMP 10/25/2021 (Approximate)   SpO2 100%   BMI 33.29 kg/m   Foot: Inspection:  No obvious bony deformity.  No swelling, erythema, or bruising.  Normal arch Palpation: No tenderness to palpation ROM: Full  ROM of the ankle. Normal midfoot flexibility Strength: 5/5 strength ankle in all planes Neurovascular: N/V intact distally in the lower extremity   Hip:  - Inspection: No gross deformity, no swelling, erythema, or ecchymosis - Palpation: TTP over greater trochanter bilaterally  - ROM: Normal range of motion on Flexion, extension, abduction, internal and external rotation - Strength: Normal strength. - Neuro/vasc: NV intact distally - Special Tests: negative log roll test   ASSESSMENT/PLAN:   Leg pain, lateral Bilateral leg pain with lying on either side or sitting  Suspect this is bursitis of greater trochanter Will treat  conservatively and have patient follow up in 2 weeks  7.5mg  meloxicam x10-14 days  Recommended heat therapy in 20 min intervals  Patient given exercises/stretches    Neuropathy Pain in right lateral foot consistent with neuropathic pain  Will have check hgb A1c today  If normal A1c, will have patient go for XR to evaluate for potential avulsion fracture as point of symptoms is at location of peroneus tendon insertion/base of fifth metatarsal  Patient was agreeable with this plan  Will f/u in 2 weeks      Ronnald Ramp, MD [facility]

## 2021-11-29 NOTE — Progress Notes (Signed)
    SUBJECTIVE:   CHIEF COMPLAINT / HPI: f/u neuropathy and hip pain   Patient was recently evaluated for hip pain.  She was treated with meloxicam and other conservative therapies with recommendation for follow-up if no improvement in pain. Today she states she had minimal improvement in her hip pain while taking the anti-inflammatory medication  She reports that she did sleep better while lying on her sides with the medication otherwise she continues to have pain with prolonged sitting (>15 min) or prolonged standing  She is agreeable to bursa injections today   Pain in foot, likely neuropathic Patient reported lateral foot pain.  Her hemoglobin A1c was checked to see if diabetes could be because of neuropathic pain.  A1c was within normal limits at 5.3 Patient is agreeable to x-ray of the foot today  She denies any trauma to her feet recently with the onset of pain  She denies that her feet feel weak    PERTINENT  PMH / PSH:  Neuropathy  Hx of ankle avulsion fractures   OBJECTIVE:   BP 118/72   Pulse 80   Ht 5\' 2"  (1.575 m)   Wt 181 lb (82.1 kg)   LMP 11/26/2021 (Approximate)   SpO2 98%   BMI 33.11 kg/m   Hip:  - Inspection: No gross deformity, no swelling, erythema, or ecchymosis - Palpation: TTP  over greater trochanters bilaterally  - ROM: Normal range of motion on Flexion, extension, abduction, internal and external rotation - Strength: Normal strength. - Neuro/vasc: NV intact distally - Special Tests: Negative FABER and FADIR.   Foot: Inspection:  No obvious bony deformity.  No swelling, erythema, or bruising.  Normal arch Palpation: No tenderness to palpation ROM: Full  ROM of the ankle. Normal midfoot flexibility Strength: 5/5 strength ankle in all planes Neurovascular: N/V intact distally in the lower extremity Special tests: Negative anterior drawer. Negative squeeze.   ASSESSMENT/PLAN:   Greater trochanteric bursitis of both hips After informed written  consent timeout was performed, patient was lying in lateral recumbent position on exam table. The area overlying L&R trochanteric bursa were prepped with alcohol swabs then injected with 4:1 lidocaine: depomedrol using a spinal needle bilaterally. Patient tolerated procedure well without immediate complications.   Numbness of right foot Normal hgb A1c from previous visit  Will order XR of right foot to evaluate for avulsion fracture      01/27/2022, MD [facility]

## 2021-11-30 ENCOUNTER — Encounter: Payer: Self-pay | Admitting: Family Medicine

## 2021-11-30 ENCOUNTER — Ambulatory Visit (INDEPENDENT_AMBULATORY_CARE_PROVIDER_SITE_OTHER): Payer: Self-pay | Admitting: Family Medicine

## 2021-11-30 VITALS — BP 118/72 | HR 80 | Ht 62.0 in | Wt 181.0 lb

## 2021-11-30 DIAGNOSIS — M25551 Pain in right hip: Secondary | ICD-10-CM

## 2021-11-30 DIAGNOSIS — M7061 Trochanteric bursitis, right hip: Secondary | ICD-10-CM

## 2021-11-30 DIAGNOSIS — M7062 Trochanteric bursitis, left hip: Secondary | ICD-10-CM

## 2021-11-30 DIAGNOSIS — R200 Anesthesia of skin: Secondary | ICD-10-CM

## 2021-11-30 MED ORDER — METHYLPREDNISOLONE ACETATE 40 MG/ML IJ SUSP
40.0000 mg | Freq: Once | INTRAMUSCULAR | Status: AC
  Administered 2021-11-30: 40 mg via INTRA_ARTICULAR

## 2021-11-30 MED ORDER — METHYLPREDNISOLONE ACETATE 80 MG/ML IJ SUSP
40.0000 mg | Freq: Once | INTRAMUSCULAR | Status: AC
  Administered 2021-11-30: 40 mg via INTRA_ARTICULAR

## 2021-11-30 NOTE — Patient Instructions (Addendum)
Today you received injections for your likely greater trochanteric hip bursitis pain.  Over the next few days, please take it easy as you move from seated to standing positions.  Notify our office if you have worsening joint pain or noticed any redness or swelling or drainage from the areas where he received your injections.  I recommend following up with [place] in the next 7-10 days.  Please report to [facility] located at: [address]  [address]  [place], [place] [address]  You do not need an appointment to have xrays completed.   I will follow up with abnormal results once available.    Hip Bursitis Rehab Ask your health care provider which exercises are safe for you. Do exercises exactly as told by your health care provider and adjust them as directed. It is normal to feel mild stretching, pulling, tightness, or discomfort as you do these exercises. Stop right away if you feel sudden pain or your pain gets worse. Do not begin these exercises until told by your health care provider. Stretching exercise This exercise warms up your muscles and joints and improves the movement and flexibility of your hip. This exercise also helps to relieve pain and stiffness. Iliotibial band stretch An iliotibial band is a strong band of muscle tissue that runs from the outer side of your hip to the outer side of your thigh and knee. Lie on your side with your left / right leg in the top position. Bend your left / right knee and grab your ankle. Stretch out your bottom arm to help you balance. Slowly bring your knee back so your thigh is slightly behind your body. Slowly lower your knee toward the floor until you feel a gentle stretch on the outside of your left / right thigh. If you do not feel a stretch and your knee will not lower more toward the floor, place the heel of your other foot on top of your knee and pull your knee down toward the floor with your foot. Hold this position for  __________ seconds. Slowly return to the starting position. Repeat __________ times. Complete this exercise __________ times a day. Strengthening exercises These exercises build strength and endurance in your hip and pelvis. Endurance is the ability to use your muscles for a long time, even after they get tired. Bridge This exercise strengthens the muscles that move your thigh backward (hip extensors). Lie on your back on a firm surface with your knees bent and your feet flat on the floor. Tighten your buttocks muscles and lift your buttocks off the floor until your trunk is level with your thighs. Do not arch your back. You should feel the muscles working in your buttocks and the back of your thighs. If you do not feel these muscles, slide your feet 1-2 inches (2.5-5 cm) farther away from your buttocks. If this exercise is too easy, try doing it with your arms crossed over your chest. Hold this position for __________ seconds. Slowly lower your hips to the starting position. Let your muscles relax completely after each repetition. Repeat __________ times. Complete this exercise __________ times a day. Squats This exercise strengthens the muscles in front of your thigh and knee (quadriceps). Stand in front of a table, with your feet and knees pointing straight ahead. You may rest your hands on the table for balance but not for support. Slowly bend your knees and lower your hips like you are going to sit in a chair. Keep  your weight over your heels, not over your toes. Keep your lower legs upright so they are parallel with the table legs. Do not let your hips go lower than your knees. Do not bend lower than told by your health care provider. If your hip pain increases, do not bend as low. Hold the squat position for __________ seconds. Slowly push with your legs to return to standing. Do not use your hands to pull yourself to standing. Repeat __________ times. Complete this exercise  __________ times a day. Hip hike  Stand sideways on a bottom step. Stand on your left / right leg with your other foot unsupported next to the step. You can hold on to the railing or wall for balance if needed. Keep your knees straight and your torso square. Then lift your left / right hip up toward the ceiling. Hold this position for __________ seconds. Slowly let your left / right hip lower toward the floor, past the starting position. Your foot should get closer to the floor. Do not lean or bend your knees. Repeat __________ times. Complete this exercise __________ times a day. Single leg stand This exercise increases your balance. Without shoes, stand near a railing or in a doorway. You may hold on to the railing or door frame as needed for balance. Squeeze your left / right buttock muscles, then lift up your other foot. Do not let your left / right hip push out to the side. It is helpful to stand in front of a mirror for this exercise so you can watch your hip. Hold this position for __________ seconds. Repeat __________ times. Complete this exercise __________ times a day. This information is not intended to replace advice given to you by your health care provider. Make sure you discuss any questions you have with your health care provider. Document Revised: 04/25/2021 Document Reviewed: 04/25/2021 Elsevier Patient Education  2023 [employer].

## 2021-12-03 DIAGNOSIS — R200 Anesthesia of skin: Secondary | ICD-10-CM | POA: Insufficient documentation

## 2021-12-03 DIAGNOSIS — M7061 Trochanteric bursitis, right hip: Secondary | ICD-10-CM | POA: Insufficient documentation

## 2021-12-03 NOTE — Assessment & Plan Note (Signed)
Normal hgb A1c from previous visit  Will order XR of right foot to evaluate for avulsion fracture

## 2021-12-03 NOTE — Assessment & Plan Note (Signed)
After informed written consent timeout was performed, patient was lying in lateral recumbent position on exam table. The area overlying L&R trochanteric bursa were prepped with alcohol swabs then injected with 4:1 lidocaine: depomedrol using a spinal needle bilaterally. Patient tolerated procedure well without immediate complications.

## 2021-12-15 NOTE — Progress Notes (Deleted)
    SUBJECTIVE:   CHIEF COMPLAINT / HPI:   Hip pain Last visit on 11/30/2021 patient was given steroid injections of bilateral trochanteric bursa.  Numbness right foot X-ray right foot ordered at last visit which has not yet been completed***  PERTINENT  PMH / PSH: ***  OBJECTIVE:   LMP 11/26/2021 (Approximate)  ***  General: NAD, pleasant, able to participate in exam Cardiac: RRR, no murmurs. Respiratory: CTAB, normal effort, No wheezes, rales or rhonchi Abdomen: Bowel sounds present, nontender, nondistended, no hepatosplenomegaly. Extremities: no edema or cyanosis. Skin: warm and dry, no rashes noted Neuro: alert, no obvious focal deficits Psych: Normal affect and mood  ASSESSMENT/PLAN:   No problem-specific Assessment & Plan notes found for this encounter.     Dr. Erick Alley, DO [facility]    {    This will disappear when note is signed, click to select method of visit    :1}

## 2021-12-17 ENCOUNTER — Ambulatory Visit: Payer: Self-pay | Admitting: Student

## 2022-01-01 ENCOUNTER — Ambulatory Visit
Admission: RE | Admit: 2022-01-01 | Discharge: 2022-01-01 | Disposition: A | Discharge status: Home / Self Care | Payer: No Typology Code available for payment source | Source: Ambulatory Visit | Attending: Family Medicine | Admitting: Family Medicine

## 2022-01-01 DIAGNOSIS — M7061 Trochanteric bursitis, right hip: Secondary | ICD-10-CM

## 2022-01-14 NOTE — Progress Notes (Unsigned)
  SUBJECTIVE:   CHIEF COMPLAINT / HPI:   F/u hip pain Patient seen 01/01/22 for bilateral trochanteric bursitis, trilled meloxicam 2 wk prior, than give lidocaine/depomedrol injection. Patient noted to have mild DJD in both hips and both SI joint.   Pain worse in right leg over left, has not had relief. Is taking 800 mg ibuprofen BID, with some notable relief. Injection pain relief lasted about a week. Also appreciates that she is walking with a limp, pain is radiating down behind right knee cap.   This has been an issue for the last 4-5 years. Patient can't deall with pain anymore. Describes the pain as burning if laying on it too long. Sharp and achey pain if walking on it. Pain starts within 5 minutes of walking or laying down. Has tried to sleep on belly, as opposed to sleeping on sides. Appreciates some balance issues with legs, getting them back in best position to walk after sitting.   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 Musculoskeletal:     Right hip: Bony tenderness present. No deformity. Decreased range of motion. Normal strength.     Left hip: Bony tenderness present. No deformity. Normal range of motion. Normal strength.     Comments: Internal rotation provoked mild pain in right leg, external rotation provoked significant pain in right leg. Internal and external rotation normal on right leg. Straight leg test produced pain on right side at 45 degrees. Straight leg left test without pain.    Neurological:     Mental Status: She is alert.      ASSESSMENT/PLAN:  Greater trochanteric bursitis of both hips Patient continues to have significant pain located at trochanter bilaterally. Pain treated with injections at last visit, that lasted only for week. Pain responds well to  ibuprofen. Patient with TTP on trochanter, and decreased ROM in right leg as compared to left. Hip x-ray showed mild DJD. Patient likely would respond well to PT and hand out with hip strengthening/stretching exercises given in AVS. Patient PHQ-9 score 12, all related to pain from hips. -PT referral -Ibuprofen 800 mg BID to TID -Consider Tylenol   Orders Placed This Encounter  Procedures   Ambulatory referral to Physical Therapy    Referral Priority:   Routine    Referral Type:   Physical Medicine    Referral Reason:   Specialty Services Required    Requested Specialty:   Physical Therapy    Number of Visits Requested:   1   No orders of the defined types were placed in this encounter.  No follow-ups on file. @SIGNNOTE @

## 2022-01-14 NOTE — Patient Instructions (Signed)
It was great to see you! Thank you for allowing me to participate in your care!  I recommend that you always bring your medications to each appointment as this makes it easy to ensure we are on the correct medications and helps [place] not miss when refills are needed.  Our plans for today:  - Trochanteric Bursitis  Recommend Physical Therapy  Continue to take 800 mg Ibuprofen, twice a day, as needed for pain  Hip rehab exercises in AVS packet, consider trying once a day and increasing if tolerated well.   Take care and seek immediate care sooner if you develop any concerns.   Dr. Bess Kinds, MD [facility]

## 2022-01-15 ENCOUNTER — Other Ambulatory Visit: Payer: Self-pay

## 2022-01-15 ENCOUNTER — Ambulatory Visit (INDEPENDENT_AMBULATORY_CARE_PROVIDER_SITE_OTHER): Payer: Self-pay | Admitting: Student

## 2022-01-15 ENCOUNTER — Encounter: Payer: Self-pay | Admitting: Student

## 2022-01-15 VITALS — BP 143/92 | HR 88 | Wt 185.6 lb

## 2022-01-15 DIAGNOSIS — M7062 Trochanteric bursitis, left hip: Secondary | ICD-10-CM

## 2022-01-15 DIAGNOSIS — M7061 Trochanteric bursitis, right hip: Secondary | ICD-10-CM

## 2022-01-15 NOTE — Assessment & Plan Note (Addendum)
Patient continues to have significant pain located at trochanter bilaterally. Pain treated with injections at last visit, that lasted only for week. Pain responds well to ibuprofen. Patient with TTP on trochanter, and decreased ROM in right leg as compared to left. Hip x-ray showed mild DJD. Patient likely would respond well to PT and hand out with hip strengthening/stretching exercises given in AVS. Patient PHQ-9 score 12, all related to pain from hips. -PT referral -Ibuprofen 800 mg BID to TID -Consider Tylenol

## 2022-02-07 ENCOUNTER — Other Ambulatory Visit: Payer: Self-pay | Admitting: Family Medicine

## 2022-05-07 ENCOUNTER — Other Ambulatory Visit: Payer: Self-pay | Admitting: Student

## 2022-05-07 NOTE — Telephone Encounter (Signed)
Called patient to inquire about meclizine refill request. Patient report's she's been taking it daily for last 2 days. Reports swimming feeling in her ears as well and some popping noise in the ear going from side. Patient willing to come in for ear issues. Patient feels the meclizine helps with ear issues, and report's she's only taking it once a day in the morning.  Got injection in right hip, that made hip pain worse, patient couldn't do PT because she does'nt have insurance. Patient wanting hip evaluated.   Told patient I will refill meclizine for now, but want to see her soon for an appointment where we could address health maintenance, ear and hip issues.   Patient agreeable.

## 2022-09-23 ENCOUNTER — Telehealth: Payer: Self-pay

## 2022-09-23 NOTE — Telephone Encounter (Signed)
Patient calls nurse line due to concerns for taking expired medication. She used monistat vaginal insert on Friday night. She reports that after using she noticed that medication expired in 2022. She has not had any symptoms, yeast infection is clearing up. She reports slight itchiness outside of vagina.   She is asking how she should proceed. Please advise.   Veronda Prude, RN

## 2022-09-25 NOTE — Telephone Encounter (Signed)
Called and spoke with patient about taking expired medication. Patient should not have any side effects, however medication may be less effective. Discussed that with patient. She noted symptoms are improving, but is still having some vaginal itching. Told patient to make f/u appt Friday if itching not better tomorrow. Patient agreed.

## 2022-10-29 ENCOUNTER — Telehealth: Payer: Self-pay | Admitting: Physician Assistant

## 2022-10-29 DIAGNOSIS — N926 Irregular menstruation, unspecified: Secondary | ICD-10-CM

## 2022-10-29 NOTE — Progress Notes (Signed)
Because this is an ongoing issue that is not something we handle via evisit, I feel your condition warrants further evaluation and I recommend that you be seen for a face to face visit.  Please contact your primary care physician practice to be seen/make them aware of what is going on. Many offices offer virtual options to be seen via video if you are not comfortable going in person to a medical facility at this time.  NOTE: You will NOT be charged for this eVisit.  If you do not have a PCP, [facility] offers a free physician referral service available at [phone]. Our trained staff has the experience, knowledge and resources to put you in touch with a physician who is right for you.    If you are having a true medical emergency please call 911.   Your e-visit answers were reviewed by a board certified advanced clinical practitioner to complete your personal care plan.  Thank you for using e-Visits.

## 2023-03-16 ENCOUNTER — Telehealth: Payer: Self-pay | Admitting: Family Medicine

## 2023-03-16 DIAGNOSIS — K047 Periapical abscess without sinus: Secondary | ICD-10-CM

## 2023-03-16 MED ORDER — PENICILLIN V POTASSIUM 500 MG PO TABS: 500.0000 mg | ORAL_TABLET | Freq: Three times a day (TID) | ORAL | 0 refills | Status: AC

## 2023-03-16 NOTE — Progress Notes (Signed)

## 2023-03-17 ENCOUNTER — Emergency Department (HOSPITAL_BASED_OUTPATIENT_CLINIC_OR_DEPARTMENT_OTHER)
Admission: EM | Admit: 2023-03-17 | Discharge: 2023-03-17 | Disposition: A | Discharge status: Home / Self Care | Payer: Self-pay | Attending: Emergency Medicine | Admitting: Emergency Medicine

## 2023-03-17 ENCOUNTER — Other Ambulatory Visit: Payer: Self-pay

## 2023-03-17 ENCOUNTER — Encounter (HOSPITAL_BASED_OUTPATIENT_CLINIC_OR_DEPARTMENT_OTHER): Payer: Self-pay | Admitting: *Deleted

## 2023-03-17 DIAGNOSIS — F17210 Nicotine dependence, cigarettes, uncomplicated: Secondary | ICD-10-CM | POA: Insufficient documentation

## 2023-03-17 DIAGNOSIS — L03211 Cellulitis of face: Secondary | ICD-10-CM | POA: Insufficient documentation

## 2023-03-17 DIAGNOSIS — K029 Dental caries, unspecified: Secondary | ICD-10-CM | POA: Insufficient documentation

## 2023-03-17 MED ORDER — AMOXICILLIN-POT CLAVULANATE 875-125 MG PO TABS
1.0000 | ORAL_TABLET | Freq: Once | ORAL | Status: AC
  Administered 2023-03-17: 1 via ORAL
  Filled 2023-03-17: qty 1

## 2023-03-17 MED ORDER — OXYCODONE-ACETAMINOPHEN 5-325 MG PO TABS
1.0000 | ORAL_TABLET | Freq: Once | ORAL | Status: AC
  Administered 2023-03-17: 1 via ORAL
  Filled 2023-03-17: qty 1

## 2023-03-17 MED ORDER — KETOROLAC TROMETHAMINE 60 MG/2ML IM SOLN
30.0000 mg | Freq: Once | INTRAMUSCULAR | Status: AC
  Administered 2023-03-17: 30 mg via INTRAMUSCULAR
  Filled 2023-03-17: qty 2

## 2023-03-17 MED ORDER — AMOXICILLIN-POT CLAVULANATE 875-125 MG PO TABS: 1.0000 | ORAL_TABLET | Freq: Two times a day (BID) | ORAL | 0 refills | Status: DC

## 2023-03-17 MED ORDER — ACETAMINOPHEN 325 MG PO TABS
650.0000 mg | ORAL_TABLET | Freq: Once | ORAL | Status: AC
  Administered 2023-03-17: 650 mg via ORAL
  Filled 2023-03-17: qty 2

## 2023-03-17 NOTE — ED Notes (Addendum)
Patient reports swelling to left jaw from an abscess for two days. Patient denies any drainage from abscess. Swelling noted to left side of jaw. No obstruction to airway noted.

## 2023-03-17 NOTE — ED Triage Notes (Signed)
Pt is here due to swelling on the left side of her face.  She states that she has a bad tooth on the left upper jaw and she did a e-visit yesterday and was prescribed PCN, she has taken 4 doses (two yesterday and two today) but the swelling to her left cheek has increased.

## 2023-03-17 NOTE — ED Provider Notes (Signed)
[place] EMERGENCY DEPARTMENT AT [facility] Provider Note  CSN: [id number] Arrival date & time: 03/17/23 1926  Chief Complaint(s) Oral Swelling  HPI [name] is a 47 y.o. female here today with pain and swelling over the left side of her face and upper molars.  Patient is not a diabetic.  Symptoms began yesterday.  She had a televisit and was prescribed penicillin.  She does follow with a dentist.   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 Authorizing Provider  EPINEPHrine 0.3 mg/0.3 mL IJ SOAJ injection Inject 0.3 mg into the muscle as needed for anaphylaxis. 08/20/21   Shirlean Mylar, MD  meclizine (ANTIVERT) 25 MG tablet TAKE 1 TABLET BY MOUTH THREE TIMES A DAY AS NEEDED FOR DIZZINESS 05/07/22   Bess Kinds, MD  penicillin v potassium (VEETID) 500 MG tablet Take 1 tablet (500 mg total) by mouth 3 (three) times daily for 10 days. 03/16/23 03/26/23  Delorse Lek, FNP                                                                                                                                     Past Surgical History Past Surgical History:  Procedure Laterality Date   TUBAL LIGATION     Family History Family History  Problem Relation Age of Onset   Cancer Mother        mother breast ca at 77 yoa, and colon ca at 17 yoa   Hypertension Mother    Hypertension Brother    Diabetes Brother     Social History Social History   Tobacco Use   Smoking status: Every Day    Current packs/day: 0.75    Types: Cigarettes   Smokeless tobacco: Never  Vaping Use   Vaping status: Never Used  Substance Use Topics   Alcohol use: No   Drug use: No   Allergies Bee venom  Review of Systems Review of Systems  Physical Exam Vital  Signs  I have reviewed the triage vital signs BP (!) 161/97 (BP Location: Right Arm)   Pulse 75   Temp 98.6 F (37 C) (Oral)   Resp 20   LMP 02/16/2023   SpO2 100%   Physical Exam Vitals reviewed.  Constitutional:      Appearance: She is not toxic-appearing.  HENT:     Head:     Comments: Dental caries present on the left maxillary molars.  No obvious abscess. Cardiovascular:     Rate and Rhythm: Normal rate.     ED Results and Treatments Labs (all labs ordered are listed, but only abnormal results are displayed) Labs Reviewed - No data to display                                                                                                                        Radiology No results found.  Pertinent labs & imaging results that were available during my care of the patient were reviewed by me and considered in my medical decision making (see MDM for details).  Medications Ordered in ED Medications  ketorolac (TORADOL) injection 30 mg (has no administration in time range)  acetaminophen (TYLENOL) tablet 650 mg (has no administration in time range)  oxyCODONE-acetaminophen (PERCOCET/ROXICET) 5-325 MG per tablet 1 tablet (has no administration in time range)   amoxicillin-clavulanate (AUGMENTIN) 875-125 MG per tablet 1 tablet (has no administration in time range)                                                                                                                                     Procedures Procedures  (including critical care time)  Medical Decision Making / ED Course   This patient presents to the ED for concern of facial pain and dental pain, this involves an extensive number of treatment options, and is a complaint that carries with it a high risk of complications and morbidity.  The differential diagnosis includes dental abscess, cellulitis, facial abscess  MDM: 47 year old female here today with pain in the left side of her face.  Patient's symptoms appear to be facial cellulitis related to dental infection.  On my exam, do not appreciate an obvious abscess.  She has no pocket of fluctuance in the face, or along the gum lines.  There is no infection along the floor of the mouth, no tongue protrusion.  Patient does not appear systemically  ill.  Will provide the patient with some analgesia here in the emergency department, increase her antibiotic coverage to Augmentin.  Patient knows that she needs to follow-up with a dentist.    Lab Tests: -I ordered, reviewed, and interpreted labs.   The pertinent results include:   Labs Reviewed - No data to display    EKG   EKG Interpretation Date/Time:    Ventricular Rate:    PR Interval:    QRS Duration:    QT Interval:    QTC Calculation:   R Axis:      Text Interpretation:           Imaging Studies ordered: I considered imaging studies, however based on physical exam did not believe that it would clinically change management.  Medicines ordered and prescription drug management: Meds ordered this encounter  Medications   ketorolac (TORADOL) injection 30 mg   acetaminophen (TYLENOL) tablet 650 mg   oxyCODONE-acetaminophen (PERCOCET/ROXICET) 5-325 MG per tablet 1  tablet   amoxicillin-clavulanate (AUGMENTIN) 875-125 MG per tablet 1 tablet    -I have reviewed the patients home medicines and have made adjustments as needed   Reevaluation: After the interventions noted above, I reevaluated the patient and found that they have :improved  Co morbidities that complicate the patient evaluation  Past Medical History:  Diagnosis Date   Vertigo    Vertigo       Dispostion: I considered admission for this patient, however analgesia and antibiotics would be first-line.     Final Clinical Impression(s) / ED Diagnoses Final diagnoses:  Dental caries  Facial cellulitis     @PCDICTATION @    Anders Simmonds T, DO 03/17/23 2124

## 2023-03-17 NOTE — Discharge Instructions (Addendum)
You can take Augmentin once in the morning and once in the evening for the next 10 days.  You received your first dose here in the emergency department.  You can take 1000 mg of Tylenol every 8 hours, 400 mg of ibuprofen every 6 hours.  Follow-up with a dentist within the next 2 weeks.  Return to the emergency department if you develop fever, notice swelling around your eye, or feels that the symptoms are not improving after 48 hours.

## 2023-03-25 ENCOUNTER — Telehealth: Payer: Self-pay | Admitting: Physician Assistant

## 2023-03-25 DIAGNOSIS — T3695XA Adverse effect of unspecified systemic antibiotic, initial encounter: Secondary | ICD-10-CM

## 2023-03-25 DIAGNOSIS — B379 Candidiasis, unspecified: Secondary | ICD-10-CM

## 2023-03-25 MED ORDER — FLUCONAZOLE 150 MG PO TABS: ORAL_TABLET | ORAL | 0 refills | Status: DC

## 2023-03-25 NOTE — Progress Notes (Signed)

## 2023-03-25 NOTE — Progress Notes (Signed)
I have spent 5 minutes in review of e-visit questionnaire, review and updating patient chart, medical decision making and response to patient.   Mia Milan Cody Jacklynn Dehaas, PA-C    

## 2023-04-20 ENCOUNTER — Ambulatory Visit (HOSPITAL_COMMUNITY)
Admission: EM | Admit: 2023-04-20 | Discharge: 2023-04-20 | Disposition: A | Discharge status: Home / Self Care | Payer: Self-pay | Attending: Family Medicine | Admitting: Family Medicine

## 2023-04-20 ENCOUNTER — Ambulatory Visit: Payer: Self-pay

## 2023-04-20 ENCOUNTER — Encounter (HOSPITAL_COMMUNITY): Payer: Self-pay | Admitting: *Deleted

## 2023-04-20 ENCOUNTER — Ambulatory Visit (INDEPENDENT_AMBULATORY_CARE_PROVIDER_SITE_OTHER): Payer: Self-pay

## 2023-04-20 ENCOUNTER — Other Ambulatory Visit: Payer: Self-pay

## 2023-04-20 DIAGNOSIS — M25562 Pain in left knee: Secondary | ICD-10-CM

## 2023-04-20 DIAGNOSIS — M1712 Unilateral primary osteoarthritis, left knee: Secondary | ICD-10-CM

## 2023-04-20 MED ORDER — IBUPROFEN 400 MG PO TABS: 400.0000 mg | ORAL_TABLET | Freq: Four times a day (QID) | ORAL | 0 refills | Status: DC | PRN

## 2023-04-20 NOTE — ED Provider Notes (Signed)
[facility]    CSN: [id number] Arrival date & time: 04/20/23  1033      History   Chief Complaint Chief Complaint  Patient presents with   Knee Pain    HPI [name] is a 47 y.o. female.   The history is provided by the patient. No language interpreter was used.  Knee Pain Location:  Knee Time since incident:  9 days Injury: no   Knee location:  L knee Pain details:    Quality:  Throbbing and aching   Radiates to:  Does not radiate   Severity:  Severe (pain is usually between 6-10/10 in severity)   Onset quality:  Gradual   Timing:  Constant   Progression:  Worsening Chronicity:  New Dislocation: no   Prior injury to area:  No Relieved by:  Nothing Worsened by:  Flexion and bearing weight Ineffective treatments:  Immobilization (Knee brace) Associated symptoms: decreased ROM   Associated symptoms: no fever, no muscle weakness, no numbness and no tingling     Past Medical History:  Diagnosis Date   Vertigo    Vertigo     Patient Active Problem List   Diagnosis Date Noted   Greater trochanteric bursitis of both hips 12/03/2021   Numbness of right foot 12/03/2021   Leg pain, lateral 11/19/2021   Neuropathy 11/19/2021   Chronic pain of left knee 10/18/2020   Chronic cough 08/01/2020   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   Loud snoring 11/15/2019   Chest pain due to GERD 11/15/2019   At risk for obstructive sleep apnea 10/22/2019   UTI (urinary tract infection) 10/22/2017   Healthcare maintenance 08/12/2014   Obesity 06/08/2012   BPPV (benign paroxysmal positional vertigo) 12/10/2011   Family history of breast cancer in first degree relative 08/30/2010   Family history of colon cancer 08/30/2010   DUB (dysfunctional uterine bleeding) 08/14/2010   HYPERLIPIDEMIA 04/10/2010   TOBACCO DEPENDENCE 07/24/2006    Past Surgical History:  Procedure Laterality Date   TUBAL LIGATION      OB History     Gravida  3   Para      Term       Preterm      AB  1   Living  2      SAB      IAB  1   Ectopic      Multiple      Live Births  2            Home Medications    Prior to Admission medications   Medication Sig Start Date End Date Taking? Authorizing Provider  amoxicillin-clavulanate (AUGMENTIN) 875-125 MG tablet Take 1 tablet by mouth every 12 (twelve) hours. 03/17/23   Anders Simmonds T, DO  EPINEPHrine 0.3 mg/0.3 mL IJ SOAJ injection Inject 0.3 mg into the muscle as needed for anaphylaxis. 08/20/21   Shirlean Mylar, MD  fluconazole (DIFLUCAN) 150 MG tablet Take 1 tablet PO once. Repeat in 3 days if needed. 03/25/23   Waldon Merl, PA-C  meclizine (ANTIVERT) 25 MG tablet TAKE 1 TABLET BY MOUTH THREE TIMES A DAY AS NEEDED FOR DIZZINESS 05/07/22   Bess Kinds, MD    Family History Family History  Problem Relation Age of Onset   Cancer Mother        mother breast ca at 60 yoa, and colon ca at 41 yoa   Hypertension Mother    Hypertension Brother    Diabetes Brother  Social History Social History   Tobacco Use   Smoking status: Every Day    Current packs/day: 0.75    Types: Cigarettes   Smokeless tobacco: Never  Vaping Use   Vaping status: Never Used  Substance Use Topics   Alcohol use: No   Drug use: No     Allergies   Bee venom   Review of Systems Review of Systems  Constitutional:  Negative for fever.  Respiratory: Negative.    Cardiovascular: Negative.   All other systems reviewed and are negative.    Physical Exam Triage Vital Signs ED Triage Vitals  Encounter Vitals Group     BP 04/20/23 1309 135/86     Systolic BP Percentile --      Diastolic BP Percentile --      Pulse Rate 04/20/23 1309 78     Resp 04/20/23 1309 18     Temp 04/20/23 1309 98.4 F (36.9 C)     Temp src --      SpO2 04/20/23 1309 97 %     Weight --      Height --      Head Circumference --      Peak Flow --      Pain Score 04/20/23 1307 10     Pain Loc --      Pain Education --       Exclude from Growth Chart --    No data found.  Updated Vital Signs BP 135/86   Pulse 78   Temp 98.4 F (36.9 C)   Resp 18   LMP 04/19/2023 (Approximate)   SpO2 97%   Visual Acuity Right Eye Distance:   Left Eye Distance:   Bilateral Distance:    Right Eye Near:   Left Eye Near:    Bilateral Near:     Physical Exam Vitals and nursing note reviewed.  Cardiovascular:     Rate and Rhythm: Normal rate and regular rhythm.     Heart sounds: Normal heart sounds. No murmur heard. Pulmonary:     Effort: Pulmonary effort is normal. No respiratory distress.     Breath sounds: Normal breath sounds. No wheezing.  Musculoskeletal:     Left knee: No swelling, deformity, erythema or crepitus. Decreased range of motion. Tenderness present over the medial joint line and lateral joint line. No patellar tendon tenderness.      UC Treatments / Results  Labs (all labs ordered are listed, but only abnormal results are displayed) Labs Reviewed - No data to display  EKG   Radiology DG Knee Complete 4 Views Left  Result Date: 04/20/2023 CLINICAL DATA:  knee pain EXAM: LEFT KNEE - COMPLETE 4+ VIEW COMPARISON:  None Available. FINDINGS: No acute fracture or dislocation. Mild joint space narrowing and osteophyte formation in the medial compartment. No area of erosion or osseous destruction. No unexpected radiopaque foreign body. Soft tissues are unremarkable. IMPRESSION: Mild degenerative changes of the medial compartment. Electronically Signed   By: Meda Klinefelter M.D.   On: 04/20/2023 13:40    Procedures Procedures (including critical care time)  Medications Ordered in UC Medications - No data to display  Initial Impression / Assessment and Plan / UC Course  I have reviewed the triage vital signs and the nursing notes.  Pertinent labs & imaging results that were available during my care of the patient were reviewed by me and considered in my medical decision making (see chart  for details).  Clinical Course as of 04/20/23  1344  Sun Apr 20, 2023  1344 Left knee sprain: Xray shows mild arthritis Gout is less likely May use Tylenol or Ibuprofen as needed for pain I escribed Ibuprofen F/U with PCP soon for reassessment if there is no improvement [KE]    Clinical Course User Index [KE] Doreene Eland, MD     Final Clinical Impressions(s) / UC Diagnoses   Final diagnoses:  Acute pain of left knee     Discharge Instructions      It was a nice meeting. Your knee x-ray is negative. You might be having knee ligament or muscle strain due to overuse. Let us try Ibuprofen as needed for pain. Please see [place] or your PCP soon if there is no improvement.      ED Prescriptions   None    PDMP not reviewed this encounter.   Doreene Eland, MD 04/20/23 1345

## 2023-04-20 NOTE — Discharge Instructions (Addendum)
It was a nice meeting. Your knee x-ray ishows that you have a mild arthritis. You might be having knee ligament or muscle strain due to overuse. Let us try Ibuprofen as needed for pain. I sent this to your pharmacy to use only when needed for pain.  Please see [place] or your PCP soon if there is no improvement.

## 2023-04-20 NOTE — ED Triage Notes (Signed)
Pt reports Lt knee pain for 2 weeks . Pt used a knee strap at work which did not help. P thas increased pain after sitting. Worse since Friday.

## 2023-05-06 ENCOUNTER — Ambulatory Visit (INDEPENDENT_AMBULATORY_CARE_PROVIDER_SITE_OTHER): Payer: Self-pay | Admitting: Student

## 2023-05-06 ENCOUNTER — Encounter: Payer: Self-pay | Admitting: Student

## 2023-05-06 VITALS — BP 120/84 | HR 98 | Ht 62.0 in | Wt 198.2 lb

## 2023-05-06 DIAGNOSIS — Z1211 Encounter for screening for malignant neoplasm of colon: Secondary | ICD-10-CM

## 2023-05-06 DIAGNOSIS — Z13220 Encounter for screening for lipoid disorders: Secondary | ICD-10-CM

## 2023-05-06 DIAGNOSIS — Z0000 Encounter for general adult medical examination without abnormal findings: Secondary | ICD-10-CM

## 2023-05-06 DIAGNOSIS — M1710 Unilateral primary osteoarthritis, unspecified knee: Secondary | ICD-10-CM | POA: Insufficient documentation

## 2023-05-06 DIAGNOSIS — Z1231 Encounter for screening mammogram for malignant neoplasm of breast: Secondary | ICD-10-CM

## 2023-05-06 DIAGNOSIS — Z131 Encounter for screening for diabetes mellitus: Secondary | ICD-10-CM

## 2023-05-06 NOTE — Assessment & Plan Note (Signed)
Patient comes in for annual physical exam.  Patient complains of knee pain, from arthritis, recommend treatment with topical analgesia.  Patient in relationship with partner that she wants to get out of, but denies any concern for intimate partner violence.  Patient declines any STI testing today, reports she is sexually active, has had a tubal ligation, does not use protection.  Patient blood pressure at goal, and denies any symptoms of depression. Patient does not need to be screened for osteoporosis.  Patient due for mammogram and colonoscopy given family history of mother with (breast cancer, colon cancer, bone cancer). - Screening for high cholesterol/diabetes - Referral for colonoscopy/mammogram

## 2023-05-06 NOTE — Assessment & Plan Note (Signed)
Patient comes in w/ complaints of chronic knee pain in left knee. Patient seen in UC and found to have arthritis. Patient uses salonpass to help, and appreciates decent mobility. Patient appreciates that knee feels swollen at times, when squatting. Patient would like to avoid medication to treat this. Discussed possibly considering knee injection in future, if topical analgesia does not work. Patient knee exam benign today.  - Salonpass for pain (lidocain patch) - Voltaren gel for pain -Consider knee injections - Consider PT

## 2023-05-06 NOTE — Patient Instructions (Addendum)
It was great to see you! Thank you for allowing me to participate in your care!  I recommend that you always bring your medications to each appointment as this makes it easy to ensure we are on the correct medications and helps [place] not miss when refills are needed.  Our plans for today:  - Check up  Checking Cholesterol, Diabetes  Due for Mammogram and Colonoscopy (schedule at your convenience)     Go to [facility] for blood draw: [address], [place], [place] [address]  [phone]   - Knee Pain Continue using Salonpass as needed (can wear at night. Can be worn for 12 hours on then 12 hours off). Can also use Voltaren gel during the day If knee pain or swelling worsen or don't improve, make appointment for knee injections   Can take Tylenol or Ibuprofen as needed   Take care and seek immediate care sooner if you develop any concerns.   Dr. Bess Kinds, MD [facility]

## 2023-05-06 NOTE — Progress Notes (Signed)
SUBJECTIVE:   Chief compliant/HPI: annual examination  [name] is a 47 y.o. who presents today for an annual exam.   History tabs reviewed and updated.   Diet: planning on restarting diet after christmas and losing weight. Has lost 40 lbs in the past using this diet.   Knee Pain Using tylenol and salonpass for pain. Notes swelling and discomfort with squatting. Appreciates it feels swollen at times. Went to urgent care and was told that she has arthritis. Patient does not like taking medication/pills. She was offered ibuprofen for her pain, but has avoided it for now.   OBJECTIVE:   BP 120/84   Pulse 98   Ht 5\' 2"  (1.575 m)   Wt 198 lb 3.2 oz (89.9 kg)   LMP 04/19/2023 (Approximate)   SpO2 100%   BMI 36.25 kg/m   Physical Exam Constitutional:      General: She is not in acute distress.    Appearance: Normal appearance. She is not ill-appearing.  Cardiovascular:     Rate and Rhythm: Normal rate and regular rhythm.     Pulses: Normal pulses.     Heart sounds: Normal heart sounds. No murmur heard.    No friction rub. No gallop.  Pulmonary:     Effort: Pulmonary effort is normal. No respiratory distress.     Breath sounds: Normal breath sounds. No stridor. No wheezing, rhonchi or rales.  Abdominal:     General: Abdomen is flat. There is no distension.     Palpations: Abdomen is soft. There is no mass.     Tenderness: There is no abdominal tenderness. There is no guarding or rebound.     Hernia: No hernia is present.  Neurological:     Mental Status: She is alert.  Psychiatric:        Mood and Affect: Mood normal.        Behavior: Behavior normal.      ASSESSMENT/PLAN:   Arthritis of knee Patient comes in w/ complaints of chronic knee pain in left knee. Patient seen in UC and found to have arthritis. Patient uses salonpass to help, and appreciates decent mobility. Patient appreciates that knee feels swollen at times, when squatting. Patient would like to  avoid medication to treat this. Discussed possibly considering knee injection in future, if topical analgesia does not work. Patient knee exam benign today.  - Salonpass for pain (lidocain patch) - Voltaren gel for pain -Consider knee injections - Consider PT  Annual physical exam Patient comes in for annual physical exam.  Patient complains of knee pain, from arthritis, recommend treatment with topical analgesia.  Patient in relationship with partner that she wants to get out of, but denies any concern for intimate partner violence.  Patient declines any STI testing today, reports she is sexually active, has had a tubal ligation, does not use protection.  Patient blood pressure at goal, and denies any symptoms of depression. Patient does not need to be screened for osteoporosis.  Patient due for mammogram and colonoscopy given family history of mother with (breast cancer, colon cancer, bone cancer). - Screening for high cholesterol/diabetes - Referral for colonoscopy/mammogram    Annual Examination  See AVS for age appropriate recommendations.   PHQ score not completed, mood is good, sometimes sad missing family members. But feels she is doing well.   Blood pressure reviewed and at goal.  Asked about intimate partner violence and resources given as appropriate  The patient currently uses tubal ligation  for contraception.   Considered the following items based upon USPSTF recommendations: Diabetes screening: discussed and ordered Screening for elevated cholesterol: discussed and ordered HIV testing: discussed Neg 04/14/20, declined today Hepatitis C: discussed Neg 04/14/20 Hepatitis B: discussed and ordered for next visit  Syphilis if at high risk: discussed and ordered GC/CT  Declined Reviewed risk factors for latent tuberculosis and not indicated Reviewed risk factors for osteoporosis. Using FRAX tool estimated risk of major osteoporotic fracture of  2.9%, early screening not  ordered   Discussed family history, BRCA testing not indicated.  Cervical cancer screening: prior Pap reviewed, repeat due in 2 years Breast cancer screening: discussed and ordered mammogram based upon personal history  Colorectal cancer screening: discussed, colonoscopy ordered if age 52 or over.   Follow up in 1 year or sooner if indicated.    Bess Kinds, MD [facility]

## 2023-05-09 ENCOUNTER — Other Ambulatory Visit: Payer: Self-pay | Admitting: Student

## 2023-05-09 MED ORDER — MECLIZINE HCL 25 MG PO TABS: 25.0000 mg | ORAL_TABLET | ORAL | 0 refills | Status: DC | PRN

## 2023-05-14 ENCOUNTER — Other Ambulatory Visit: Payer: Self-pay | Admitting: Student

## 2023-05-14 DIAGNOSIS — Z13220 Encounter for screening for lipoid disorders: Secondary | ICD-10-CM

## 2023-05-14 DIAGNOSIS — Z131 Encounter for screening for diabetes mellitus: Secondary | ICD-10-CM

## 2023-06-11 ENCOUNTER — Other Ambulatory Visit: Payer: Self-pay | Admitting: Obstetrics and Gynecology

## 2023-06-11 DIAGNOSIS — Z1231 Encounter for screening mammogram for malignant neoplasm of breast: Secondary | ICD-10-CM

## 2023-07-15 ENCOUNTER — Other Ambulatory Visit: Payer: Self-pay | Admitting: Student

## 2023-07-31 ENCOUNTER — Ambulatory Visit: Payer: Self-pay | Admitting: *Deleted

## 2023-07-31 ENCOUNTER — Other Ambulatory Visit: Payer: Self-pay

## 2023-07-31 ENCOUNTER — Ambulatory Visit
Admission: RE | Admit: 2023-07-31 | Discharge: 2023-07-31 | Disposition: A | Discharge status: Home / Self Care | Payer: Self-pay | Source: Ambulatory Visit | Attending: Obstetrics and Gynecology | Admitting: Obstetrics and Gynecology

## 2023-07-31 VITALS — BP 133/101 | Wt 194.5 lb

## 2023-07-31 DIAGNOSIS — Z1239 Encounter for other screening for malignant neoplasm of breast: Secondary | ICD-10-CM

## 2023-07-31 DIAGNOSIS — Z1211 Encounter for screening for malignant neoplasm of colon: Secondary | ICD-10-CM

## 2023-07-31 DIAGNOSIS — Z1231 Encounter for screening mammogram for malignant neoplasm of breast: Secondary | ICD-10-CM

## 2023-07-31 NOTE — Progress Notes (Signed)
 [name] is a 48 y.o. female who presents to [facility] clinic today with no complaints.    Pap Smear: Pap smear not completed today. Last Pap smear was 10/22/2019 at [facility] clinic and was normal with negative HPV. Per patient has no history of an abnormal Pap smear. Last Pap smear result is available in Epic.   Physical exam: Breasts Breasts symmetrical. No skin abnormalities bilateral breasts. No nipple retraction bilateral breasts. No nipple discharge bilateral breasts. No lymphadenopathy. No lumps palpated bilateral breasts. No complaints of pain or tenderness on exam.      Pelvic/Bimanual Pap is not indicated today per BCCCP guidelines.   Smoking History: Patient is a current smoker. Discussed smoking cessation with patient. Referred to the [facility] Quitline.   Patient Navigation: Patient education provided. Access to services provided for patient through BCCCP program.   Colorectal Cancer Screening: Per patient has never had colonoscopy completed. FIT Test given to patient to complete. No complaints today.    Breast and Cervical Cancer Risk Assessment: Patient has family history of her mother having breast cancer. Patient has no known genetic mutations or history of radiation treatment to the chest before age 25. Patient does not have history of cervical dysplasia, immunocompromised, or DES exposure in-utero.  Risk Scores as of Encounter on 07/31/2023     [name]           5-year 0.69%   Lifetime 7.79%            Last calculated by Caprice Red, CMA on 07/31/2023 at  3:19 PM       A: BCCCP exam without pap smear No complaints.  P: Referred patient to the [facility] for a screening mammogram on mobile unit. Appointment scheduled Thursday, July 31, 2023 at 1530.  Erin Heidelberg, RN 07/31/2023 3:07 PM

## 2023-07-31 NOTE — Patient Instructions (Signed)
 Explained breast self awareness with Llana Aliment. Patient did not need a Pap smear today due to last Pap smear and HPV typing was 10/22/2019. Let her know BCCCP will cover Pap smears and HPV typing every 5 years unless has a history of abnormal Pap smears. Referred patient to the [facility] for a screening mammogram on mobile unit. Appointment scheduled Thursday, July 31, 2023 at 1530. Patient aware of appointment and will be there. Let patient know the Breast Center will follow up with her within the next couple weeks with results of mammogram by letter or phone. Elyse Jarvis Camerer verbalized understanding.  Trevyn Lumpkin, Kathaleen Maser, RN 3:07 PM

## 2023-08-07 ENCOUNTER — Encounter: Payer: Self-pay | Admitting: Obstetrics & Gynecology

## 2023-08-13 LAB — FECAL OCCULT BLOOD, IMMUNOCHEMICAL: Fecal Occult Bld: NEGATIVE

## 2023-08-13 LAB — SPECIMEN STATUS REPORT

## 2023-09-02 ENCOUNTER — Encounter (HOSPITAL_COMMUNITY): Payer: Self-pay

## 2023-09-02 ENCOUNTER — Ambulatory Visit (HOSPITAL_COMMUNITY)
Admission: EM | Admit: 2023-09-02 | Discharge: 2023-09-02 | Disposition: A | Discharge status: Home / Self Care | Payer: Self-pay | Attending: Emergency Medicine | Admitting: Emergency Medicine

## 2023-09-02 ENCOUNTER — Ambulatory Visit (INDEPENDENT_AMBULATORY_CARE_PROVIDER_SITE_OTHER)

## 2023-09-02 DIAGNOSIS — S9031XA Contusion of right foot, initial encounter: Secondary | ICD-10-CM

## 2023-09-02 NOTE — ED Triage Notes (Signed)
 Pt states dropped her end table on her rt foot 2wks ago. Pt c/o tender to touch, knot on top of foot with swelling. Took ibuprofen as needed.

## 2023-09-02 NOTE — ED Provider Notes (Signed)
 [facility]  Note:  This document was prepared using Dragon voice recognition software and may include unintentional dictation errors.  MRN: [id number] DOB: Nov 13, 1975  Subjective:   [name] is a 48 y.o. female presenting for right foot pain x 2 weeks after dropping an end table on her foot.  Patient reports this is severely tender to touch, and notices swelling and a hardened area over where she was hit.  The patient has been taking ibuprofen as needed with mild improvement.  Patient was concerned about the knot on top of her bone.  Patient reports she does not think it is necessarily broken because she has been walking on it with mild discomfort but is concerned when she felt the knot on top of the bone.  Denies any other previous injury or traumas.  No current facility-administered medications for this encounter.  Current Outpatient Medications:    EPINEPHrine 0.3 mg/0.3 mL IJ SOAJ injection, Inject 0.3 mg into the muscle as needed for anaphylaxis., Disp: 2 each, Rfl: 0   ibuprofen (ADVIL) 400 MG tablet, Take 1 tablet (400 mg total) by mouth every 6 (six) hours as needed., Disp: 30 tablet, Rfl: 0   meclizine (ANTIVERT) 25 MG tablet, TAKE 1 TABLET (25 MG TOTAL) BY MOUTH AS NEEDED FOR DIZZINESS, Disp: 60 tablet, Rfl: 0   Allergies  Allergen Reactions   Bee Venom Anaphylaxis    Past Medical History:  Diagnosis Date   Vertigo    Vertigo      Past Surgical History:  Procedure Laterality Date   TUBAL LIGATION      Family History  Problem Relation Age of Onset   Cancer Mother        mother breast ca at 9 yoa, and colon ca at 12 yoa   Hypertension Mother    Hypertension Brother    Diabetes Brother    Breast cancer Neg Hx     Social History   Tobacco Use   Smoking status: Every Day    Current packs/day: 0.75    Types: Cigarettes   Smokeless tobacco: Never  Vaping Use   Vaping status: Never Used  Substance Use Topics   Alcohol use: No   Drug use:  No    ROS Refer to HPI for ROS details.  Objective:   Vitals: BP (!) 151/92 (BP Location: Right Arm)   Pulse 77   Temp 98.9 F (37.2 C) (Oral)   Resp 18   LMP 08/13/2023 (Approximate)   SpO2 98%   Physical Exam Vitals and nursing note reviewed.  Constitutional:      General: She is not in acute distress.    Appearance: Normal appearance. She is well-developed. She is not ill-appearing or toxic-appearing.  HENT:     Head: Normocephalic.  Cardiovascular:     Rate and Rhythm: Normal rate.  Pulmonary:     Effort: Pulmonary effort is normal. No respiratory distress.  Musculoskeletal:     Right foot: Decreased range of motion. Normal capillary refill. Swelling, deformity, tenderness and bony tenderness present. Normal pulse.  Skin:    General: Skin is warm and dry.     Capillary Refill: Capillary refill takes less than 2 seconds.  Neurological:     General: No focal deficit present.     Mental Status: She is alert and oriented to person, place, and time.  Psychiatric:        Mood and Affect: Mood normal.     Procedures  No results found  for this or any previous visit (from the past 24 hours).  Assessment and Plan :   1. Contusion of right foot, initial encounter (Primary) - DG Foot Complete Right x-ray performed in UC shows no acute fracture or dislocation, most likely soft tissue contusion. -Continue using over-the-counter ibuprofen 600 to 800 mg every 6-8 hours as needed for inflammation and pain -Apply ice 2-3 times a day for 10 to 15 minutes at a time for inflammation and pain. -Continue to monitor symptoms for any change in severity if there is any escalation of current symptoms or development of new symptoms follow-up in ER for further evaluation and management.  Lucky Cowboy   [place], Hansell B, [place] 09/02/23 [phone]

## 2023-09-02 NOTE — Discharge Instructions (Signed)
 1. Contusion of right foot, initial encounter (Primary) - DG Foot Complete Right x-ray performed in UC shows no acute fracture or dislocation, most likely soft tissue contusion. -Continue using over-the-counter ibuprofen 600 to 800 mg every 6-8 hours as needed for inflammation and pain -Apply ice 2-3 times a day for 10 to 15 minutes at a time for inflammation and pain. -Continue to monitor symptoms for any change in severity if there is any escalation of current symptoms or development of new symptoms follow-up in ER for further evaluation and management.

## 2023-09-10 ENCOUNTER — Other Ambulatory Visit: Payer: Self-pay | Admitting: Student

## 2023-09-10 ENCOUNTER — Encounter: Payer: Self-pay | Admitting: Student

## 2023-09-10 MED ORDER — MECLIZINE HCL 25 MG PO TABS: 25.0000 mg | ORAL_TABLET | ORAL | 0 refills | Status: DC | PRN

## 2023-09-11 MED ORDER — FLUCONAZOLE 150 MG PO TABS: 150.0000 mg | ORAL_TABLET | Freq: Once | ORAL | 0 refills | Status: AC

## 2023-10-24 ENCOUNTER — Telehealth: Payer: Self-pay | Admitting: Physician Assistant

## 2023-10-24 DIAGNOSIS — R3989 Other symptoms and signs involving the genitourinary system: Secondary | ICD-10-CM

## 2023-10-24 MED ORDER — NITROFURANTOIN MONOHYD MACRO 100 MG PO CAPS: 100.0000 mg | ORAL_CAPSULE | Freq: Two times a day (BID) | ORAL | 0 refills | Status: DC

## 2023-10-24 NOTE — Progress Notes (Signed)

## 2023-11-04 ENCOUNTER — Encounter: Payer: Self-pay | Admitting: *Deleted

## 2023-11-06 ENCOUNTER — Ambulatory Visit: Payer: Self-pay | Admitting: Student

## 2023-11-06 ENCOUNTER — Ambulatory Visit (INDEPENDENT_AMBULATORY_CARE_PROVIDER_SITE_OTHER): Payer: Self-pay | Admitting: Student

## 2023-11-06 VITALS — BP 120/80 | HR 78 | Wt 185.0 lb

## 2023-11-06 DIAGNOSIS — R208 Other disturbances of skin sensation: Secondary | ICD-10-CM

## 2023-11-06 DIAGNOSIS — E538 Deficiency of other specified B group vitamins: Secondary | ICD-10-CM

## 2023-11-06 DIAGNOSIS — S00522A Blister (nonthermal) of oral cavity, initial encounter: Secondary | ICD-10-CM

## 2023-11-06 LAB — POCT GLYCOSYLATED HEMOGLOBIN (HGB A1C): Hemoglobin A1C: 5.5 % (ref 4.0–5.6)

## 2023-11-06 NOTE — Progress Notes (Signed)
  SUBJECTIVE:   CHIEF COMPLAINT / HPI:   [name] is a 48 year old female who presents with persistent oral blister and burning sensation after using new oral care products.  In April, [name] had all her teeth extracted and began using products like polygrip and fix-a-dent for her dentures but was experiencing dry mouth. She started using Biotene mouthwash two weeks ago due to dry mouth. Last Saturday, she developed a burning sensation on the inside of her lips after eating a hot dog with ketchup and noticed a blood blister on her right upper lip. Despite avoiding acidic foods and using Peroxyl mouthwash and salt water rinses, the blister remains unchanged. She experiences a persistent burning sensation, described as a 'sunburn on the inside of my lips,' primarily when eating.  She has not consulted her dentist and has no history of herpes outbreaks. She does smoke cigarettes. No fever or chills are present.  PERTINENT  PMH / PSH: HLD, tobacco use, OSA  OBJECTIVE:  BP 120/80   Pulse 78   Wt 185 lb (83.9 kg)   SpO2 98%   BMI 33.84 kg/m  General: Well-appearing, NAD HEENT: No significant abnormalities of the oropharynx nor on the surfaces of buccal mucosa, trace pinpoint blister with minimal ulceration in the right upper lip (see photo) without drainage nor surrounding erythema nor signs of bleeding    ASSESSMENT/PLAN:   Assessment & Plan Non-thermal blister of oral cavity, initial encounter Does not appear as HSV, unlikely to be other STDs however will swab for HSV per patient request.  HSV would be unlikely given no primary outbreak.  Most likely related to trauma from dentures.  Recommend return to dentist for this and burning sensation to receive recommendations of products to use for denture adhesion.  Secondly, should this persist for 4-6 more weeks, would recommend biopsy by dentistry. Burning sensation of mouth Differential diagnosis includes diabetes, B12 deficiency, dental  products, uncontrolled sleep apnea/snoring, tobacco use.  She has confirmed sleep apnea, recommend she complete another sleep study and initiate CPAP use.  Her last sleep study was approximately 4 years ago.  Recommend tobacco cessation.  Will test for diabetes and B12 deficiency.  I fortunately do not have recommendations of dental products however should they have complaint about alcohol would discontinue.  Continue salt water rinses. Return if symptoms worsen or fail to improve. Erin Goon, DO 11/06/2023, 11:52 AM PGY-3, [facility]

## 2023-11-06 NOTE — Patient Instructions (Addendum)
 It was great to see you today! Thank you for choosing [facility] for your primary care.  Today we addressed: This looks most consistent with trauma related to the dentures.  I would recommend seeing your dentist.  However burning mouth syndrome can be correlated to diabetes and vitamin B12 deficiency which I recommend to test you for.  Dry mouth can also be correlated to snoring and sleep apnea.  If you have a bad snoring, it would be reasonable to work this up in the future with a sleep study.  I do not suspect this is related to STDs given the appearance that you have never had an HSV outbreak in the past.  If you haven't already, sign up for My Chart to have easy access to your labs results, and communication with your primary care physician. We are checking some labs today. If they are abnormal, I will call you. If they are normal, I will send you a MyChart message (if it is active) or a letter in the mail. If you do not hear about your labs in the next 2 weeks, please call the office. Return if symptoms worsen or fail to improve. Please arrive 15 minutes before your appointment to ensure smooth check in process.  We appreciate your efforts in making this happen.  Thank you for allowing me to participate in your care, Veronia Goon, DO 11/06/2023, 11:32 AM PGY-3, [facility]

## 2023-11-07 ENCOUNTER — Other Ambulatory Visit: Payer: Self-pay | Admitting: Student

## 2023-11-07 LAB — VITAMIN B12: Vitamin B-12: 145 pg/mL — ABNORMAL LOW (ref 232–1245)

## 2023-11-07 MED ORDER — VITAMIN B-12 1000 MCG PO TABS: 1000.0000 ug | ORAL_TABLET | Freq: Every day | ORAL | 0 refills | Status: AC

## 2023-11-09 LAB — SPECIMEN STATUS REPORT

## 2023-11-10 LAB — HERPES SIMPLEX VIRUS CULTURE

## 2024-01-09 ENCOUNTER — Other Ambulatory Visit: Payer: Self-pay | Admitting: *Deleted

## 2024-01-09 MED ORDER — MECLIZINE HCL 25 MG PO TABS: 25.0000 mg | ORAL_TABLET | ORAL | 0 refills | Status: AC | PRN

## 2024-01-09 NOTE — Telephone Encounter (Signed)
 Chart reviewed. Appears patient on meclizine for vertigo management for several years. Rx refilled 15 tabs. Requesting patient to be seen in clinic for follow up.

## 2024-01-29 ENCOUNTER — Encounter: Payer: Self-pay | Admitting: Family Medicine

## 2024-01-29 ENCOUNTER — Ambulatory Visit (INDEPENDENT_AMBULATORY_CARE_PROVIDER_SITE_OTHER): Payer: Self-pay | Admitting: Family Medicine

## 2024-01-29 VITALS — BP 108/64 | HR 90 | Wt 182.0 lb

## 2024-01-29 DIAGNOSIS — R42 Dizziness and giddiness: Secondary | ICD-10-CM

## 2024-01-29 DIAGNOSIS — M25561 Pain in right knee: Secondary | ICD-10-CM

## 2024-01-29 MED ORDER — DICLOFENAC SODIUM 1 % EX GEL: 2.0000 g | Freq: Four times a day (QID) | CUTANEOUS | 1 refills | Status: DC | PRN

## 2024-01-29 NOTE — Patient Instructions (Addendum)
 Thank you for visiting clinic today and allowing us  to participate in your care!  We would like to avoid long term use of meclizine  if possible. It is associated with worsening balance and developing dementia. We placed a referral to Neurology for you if you are interested in discussing your vertigo further. Someone will reach out over the next few weeks regarding scheduling.   For your knee, please try the home exercises and use voltaren  gel as needed for relief.   Reach out any time with any questions or concerns you may have - we are here for you!  Damien Cassis, MD [facility] [phone]

## 2024-01-29 NOTE — Progress Notes (Signed)
    SUBJECTIVE:   CHIEF COMPLAINT / HPI:   Vertigo -Vertigo >10 years -Used to use meclizine  every day, now once a week -Dizzy like room is spinning  -Last bad epsidoe 6 month sago  -Sometimes has headache after  -No wekaness numbness tingling   R knee pain -Reports moving furniture a few weeks ago and hurting R knee -Intermittent lateral swelling -No fall or injury  -Worse with standing -Able to walk   PERTINENT  PMH / PSH: BPPV  OBJECTIVE:   BP 108/64   Pulse 90   Wt 182 lb (82.6 kg)   SpO2 99%   BMI 33.29 kg/m   General: Well-appearing. Resting comfortably in room. CV: Normal S1/S2. No extra heart sounds. Warm and well-perfused. Pulm: Breathing comfortably on room air. CTAB. No increased WOB. Skin:  Warm, dry. Psych: Pleasant and appropriate.  MSK: Bilateral knees without significant edema. TTP of lateral R knee, negative anterior and posterior drawer test, no crepitus.   Neuro: Alert and oriented. Normal strength and sensation of bilateral extremities. Negative Romberg.  CN2: no vision changes CN3,4,6: PERRLA. EOMI CN5: Sensation intact BL CN7: Facial expressions symmetric CN8: Hearing intact BL CN9: palate symmetric  CN10: regular speech CN11: turns head against resistance CN12: tongue midline   ASSESSMENT/PLAN:   Assessment & Plan Vertigo Reassuring neuro exam today. No current dizziness.  - Discussed risks of using meclizine  long term including worsening balance, association with developing dementia  - Vestibular therapy is cost prohibitive for patient - Discussed neurology referral for further eval of possible vertigo migraine  Acute pain of right knee Presentation and exam most consistent with soft-tissue strain/sprain. Low suspicion for acute bony changes. - Discussed and provided at home exercises - Voltaren  gel prn    RTC as needed.   Damien Cassis, MD [facility]

## 2024-02-10 ENCOUNTER — Encounter: Payer: Self-pay | Admitting: Neurology

## 2024-03-02 ENCOUNTER — Encounter (HOSPITAL_COMMUNITY): Payer: Self-pay

## 2024-03-02 ENCOUNTER — Ambulatory Visit (HOSPITAL_COMMUNITY)
Admission: EM | Admit: 2024-03-02 | Discharge: 2024-03-02 | Disposition: A | Discharge status: Home / Self Care | Payer: Self-pay | Attending: Family Medicine | Admitting: Family Medicine

## 2024-03-02 ENCOUNTER — Ambulatory Visit (INDEPENDENT_AMBULATORY_CARE_PROVIDER_SITE_OTHER)

## 2024-03-02 DIAGNOSIS — M25561 Pain in right knee: Secondary | ICD-10-CM

## 2024-03-02 MED ORDER — NAPROXEN 500 MG PO TABS: 500.0000 mg | ORAL_TABLET | Freq: Two times a day (BID) | ORAL | 0 refills | Status: AC | PRN

## 2024-03-02 NOTE — ED Provider Notes (Signed)
 [facility]    CSN: [id number] Arrival date & time: 03/02/24  1539      History   Chief Complaint Chief Complaint  Patient presents with   Fall    HPI [name] is a 48 y.o. female.    Fall  Here for right knee pain.  About 1 month ago she slipped and fell onto her knees.  Right knee was bothering her but then it worsened in the last 2 weeks.  She now feels it to be tight and puffy and has a hard time bending it completely.  She has not had any swelling in the ankle.  No popping, but it is mainly felt like it was going to give way.  NKDA  She has taken 800 mg of ibuprofen  a couple of times and it is only briefly helpful. 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   Loud snoring 11/15/2019   Chest pain due to GERD 11/15/2019   At risk for obstructive sleep apnea 10/22/2019   Obesity 06/08/2012   BPPV (benign paroxysmal positional vertigo) 12/10/2011   Family history of breast cancer in first degree relative 08/30/2010   Family history of colon cancer 08/30/2010   DUB (dysfunctional uterine bleeding) 08/14/2010   HYPERLIPIDEMIA 04/10/2010   TOBACCO DEPENDENCE 07/24/2006    Past Surgical History:  Procedure Laterality Date   TUBAL LIGATION      OB History     Gravida  3   Para      Term      Preterm      AB  1   Living  2      SAB      IAB  1   Ectopic      Multiple      Live Births  2            Home Medications    Prior to Admission medications   Medication Sig Start Date End Date Taking? Authorizing Provider  naproxen (NAPROSYN) 500 MG tablet Take 1 tablet (500 mg total) by mouth 2 (two) times daily as needed (pain).  03/02/24  Yes Jon Lall K, MD  cyanocobalamin (VITAMIN B12) 1000 MCG tablet Take 1 tablet (1,000 mcg total) by mouth daily. 11/07/23   Orlando Pond, DO  EPINEPHrine  0.3 mg/0.3 mL IJ SOAJ injection Inject 0.3 mg into the muscle as needed for anaphylaxis. 08/20/21   Mahoney, Caitlin, MD  meclizine  (ANTIVERT ) 25 MG tablet Take 1 tablet (25 mg total) by mouth as needed for dizziness. 01/09/24   Diona Perkins, MD    Family History Family History  Problem Relation Age of Onset   Cancer Mother        mother breast ca at 6 yoa, and colon ca at 66 yoa   Hypertension Mother    Hypertension Brother    Diabetes Brother    Breast cancer Neg Hx     Social History Social History   Tobacco Use   Smoking status: Every Day    Current packs/day: 0.75    Types: Cigarettes    Passive exposure: Current   Smokeless tobacco: Never  Vaping Use  Vaping status: Never Used  Substance Use Topics   Alcohol use: No   Drug use: No     Allergies   Bee venom   Review of Systems Review of Systems   Physical Exam Triage Vital Signs ED Triage Vitals  Encounter Vitals Group     BP 03/02/24 1652 129/85     Girls Systolic BP Percentile --      Girls Diastolic BP Percentile --      Boys Systolic BP Percentile --      Boys Diastolic BP Percentile --      Pulse Rate 03/02/24 1652 69     Resp 03/02/24 1652 18     Temp 03/02/24 1652 97.9 F (36.6 C)     Temp Source 03/02/24 1652 Oral     SpO2 03/02/24 1652 98 %     Weight --      Height --      Head Circumference --      Peak Flow --      Pain Score 03/02/24 1650 10     Pain Loc --      Pain Education --      Exclude from Growth Chart --    No data found.  Updated Vital Signs BP 129/85 (BP Location: Left Arm)   Pulse 69   Temp 97.9 F (36.6 C) (Oral)   Resp 18   SpO2 98%   Visual Acuity Right Eye Distance:   Left Eye Distance:   Bilateral Distance:    Right Eye Near:   Left Eye Near:    Bilateral Near:     Physical  Exam Vitals reviewed.  Constitutional:      General: She is not in acute distress.    Appearance: She is not ill-appearing, toxic-appearing or diaphoretic.  Musculoskeletal:     Comments: There is tenderness and effusion of the left anterior knee.  No laxity on exam. Range of motion causes crepitus  Skin:    Coloration: Skin is not pale.  Neurological:     Mental Status: She is alert and oriented to person, place, and time.  Psychiatric:        Behavior: Behavior normal.      UC Treatments / Results  Labs (all labs ordered are listed, but only abnormal results are displayed) Labs Reviewed - No data to display  EKG   Radiology DG Knee Complete 4 Views Right Result Date: 03/02/2024 CLINICAL DATA:  injury EXAM: RIGHT KNEE - COMPLETE 4+ VIEW COMPARISON:  None Available. FINDINGS: No evidence of fracture, dislocation, or joint effusion. Mild tricompartmental degenerative changes of the knee. Soft tissues are unremarkable. IMPRESSION: No acute displaced fracture or dislocation. Electronically Signed   By: Morgane  Naveau M.D.   On: 03/02/2024 18:08    Procedures Procedures (including critical care time)  Medications Ordered in UC Medications - No data to display  Initial Impression / Assessment and Plan / UC Course  I have reviewed the triage vital signs and the nursing notes.  Pertinent labs & imaging results that were available during my care of the patient were reviewed by me and considered in my medical decision making (see chart for details).     X-ray does not show any acute bony abnormalities, but there are degenerative changes.  Naproxen is sent in as needed for pain relief and knee sleeve brace is applied.  She is given contact information for orthopedics.  Final Clinical Impressions(s) / UC Diagnoses   Final diagnoses:  Acute pain of right knee     Discharge Instructions      There are no broken bones on your x-rays.  There are signs of arthritis.  Take  naproxen 500 mg--1 tablet every 12 hours as needed for pain       ED Prescriptions     Medication Sig Dispense Auth. Provider   naproxen (NAPROSYN) 500 MG tablet Take 1 tablet (500 mg total) by mouth 2 (two) times daily as needed (pain). 30 tablet Kyen Taite K, MD      PDMP not reviewed this encounter.   Vonna Sharlet POUR, MD 03/02/24 [phone]

## 2024-03-02 NOTE — Discharge Instructions (Addendum)
 There are no broken bones on your x-rays.  There are signs of arthritis.  Take naproxen 500 mg--1 tablet every 12 hours as needed for pain

## 2024-03-02 NOTE — ED Triage Notes (Signed)
 Pt states fell at work a month ago landing on rt knee. Pt c/o swelling with decrease mobility. States taken ibuprofen  with short relief.

## 2024-04-27 ENCOUNTER — Ambulatory Visit: Admitting: Neurology

## 2024-05-11 ENCOUNTER — Ambulatory Visit: Payer: Self-pay | Admitting: Neurology
